# Patient Record
Sex: Male | Born: 1970 | Race: White | Hispanic: No | Marital: Single | Smoking: Current every day smoker
Health system: Southern US, Community
[De-identification: ages and names within clinical notes are randomized; demographics above are authoritative.]

## PROBLEM LIST (undated history)

## (undated) DIAGNOSIS — Z87442 Personal history of urinary calculi: Secondary | ICD-10-CM

## (undated) DIAGNOSIS — R0600 Dyspnea, unspecified: Secondary | ICD-10-CM

## (undated) DIAGNOSIS — K759 Inflammatory liver disease, unspecified: Secondary | ICD-10-CM

---

## 2010-02-17 ENCOUNTER — Emergency Department (HOSPITAL_COMMUNITY)
Admission: EM | Admit: 2010-02-17 | Discharge: 2010-02-17 | Payer: Self-pay | Source: Home / Self Care | Admitting: Emergency Medicine

## 2010-08-14 ENCOUNTER — Emergency Department (HOSPITAL_COMMUNITY)
Admission: EM | Admit: 2010-08-14 | Discharge: 2010-08-15 | Discharge status: Against Medical Advice | Payer: Self-pay | Attending: Emergency Medicine | Admitting: Emergency Medicine

## 2010-08-14 DIAGNOSIS — S0990XA Unspecified injury of head, initial encounter: Secondary | ICD-10-CM | POA: Insufficient documentation

## 2010-08-14 DIAGNOSIS — F17200 Nicotine dependence, unspecified, uncomplicated: Secondary | ICD-10-CM | POA: Insufficient documentation

## 2010-08-14 DIAGNOSIS — F1010 Alcohol abuse, uncomplicated: Secondary | ICD-10-CM | POA: Insufficient documentation

## 2010-08-14 DIAGNOSIS — Y998 Other external cause status: Secondary | ICD-10-CM | POA: Insufficient documentation

## 2010-08-15 ENCOUNTER — Emergency Department (HOSPITAL_COMMUNITY): Payer: Self-pay

## 2011-07-03 ENCOUNTER — Emergency Department (HOSPITAL_COMMUNITY): Payer: Self-pay

## 2011-07-03 ENCOUNTER — Emergency Department (HOSPITAL_COMMUNITY)
Admission: EM | Admit: 2011-07-03 | Discharge: 2011-07-03 | Disposition: A | Discharge status: Home / Self Care | Payer: Self-pay | Attending: Emergency Medicine | Admitting: Emergency Medicine

## 2011-07-03 ENCOUNTER — Encounter (HOSPITAL_COMMUNITY): Payer: Self-pay | Admitting: *Deleted

## 2011-07-03 DIAGNOSIS — R110 Nausea: Secondary | ICD-10-CM | POA: Insufficient documentation

## 2011-07-03 DIAGNOSIS — R319 Hematuria, unspecified: Secondary | ICD-10-CM | POA: Insufficient documentation

## 2011-07-03 LAB — URINALYSIS, ROUTINE W REFLEX MICROSCOPIC
Nitrite: POSITIVE — AB
Specific Gravity, Urine: 1.02 (ref 1.005–1.030)
Urobilinogen, UA: >8 mg/dL — ABNORMAL HIGH (ref 0.0–1.0)
pH: 6.5 (ref 5.0–8.0)

## 2011-07-03 LAB — URINE MICROSCOPIC-ADD ON

## 2011-07-03 MED ORDER — CIPROFLOXACIN HCL 500 MG PO TABS: 500.0000 mg | ORAL_TABLET | Freq: Two times a day (BID) | ORAL | Status: AC

## 2011-07-03 NOTE — ED Notes (Signed)
Pt states that he woke up with blood in his urine. Also c/o a little nausea. Denies pain or fever.

## 2011-07-03 NOTE — ED Provider Notes (Signed)
History   This chart was scribed for Laray Anger, DO by Sofie Rower. The patient was seen in room APA03/APA03 and the patient's care was started at 10:00 AM    CSN: [id number]  Arrival date & time 07/03/11  0909   First MD Initiated Contact with Patient 07/03/11 [phone]      Chief Complaint  Patient presents with  . Hematuria    HPI  Chase Hurley is a 41 y.o. male who presents to the Emergency Department complaining of gradual onset and persistence of constant hematuria since this morning.  Has been associated with mild nausea.  Denies testicular pain/swelling, no flank or back pain, no fever, no trauma.     History  Substance Use Topics  . Smoking status: Current Everyday Smoker -- 1.0 packs/day    Types: Cigarettes  . Smokeless tobacco: Not on file  . Alcohol Use: Yes    Review of Systems ROS: Statement: All systems negative except as marked or noted in the HPI; Constitutional: Negative for fever and chills. ; ; Eyes: Negative for eye pain, redness and discharge. ; ; ENMT: Negative for ear pain, hoarseness, nasal congestion, sinus pressure and sore throat. ; ; Cardiovascular: Negative for chest pain, palpitations, diaphoresis, dyspnea and peripheral edema. ; ; Respiratory: Negative for cough, wheezing and stridor. ; ; Gastrointestinal: +nausea.  Negative for vomiting, diarrhea, abdominal pain, blood in stool, hematemesis, jaundice and rectal bleeding. . ; ; Genitourinary: Negative for dysuria, flank pain and +hematuria.;  Genital:  No penile drainage or rash, no testicular pain or swelling, no scrotal rash or swelling. ; Musculoskeletal: Negative for back pain and neck pain. Negative for swelling and trauma.; ; Skin: Negative for pruritus, rash, abrasions, blisters, bruising and skin lesion.; ; Neuro: Negative for headache, lightheadedness and neck stiffness. Negative for weakness, altered level of consciousness , altered mental status, extremity weakness, paresthesias, involuntary  movement, seizure and syncope.     Allergies  Review of patient's allergies indicates no known allergies.  Home Medications  No current outpatient prescriptions on file.  BP 126/71  Pulse 68  Temp(Src) 98.5 F (36.9 C) (Oral)  Resp 16  Ht 5\' 9"  (1.753 m)  Wt 220 lb (99.791 kg)  BMI 32.49 kg/m2  SpO2 99%  Physical Exam 1005: Physical examination:  Nursing notes reviewed; Vital signs and O2 SAT reviewed;  Constitutional: Well developed, Well nourished, Well hydrated, In no acute distress; Head:  Normocephalic, atraumatic; Eyes: EOMI, PERRL, No scleral icterus; ENMT: Mouth and pharynx normal, Mucous membranes moist; Neck: Supple, Full range of motion, No lymphadenopathy; Cardiovascular: Regular rate and rhythm, No murmur, rub, or gallop; Respiratory: Breath sounds clear & equal bilaterally, No rales, rhonchi, wheezes, or rub, Normal respiratory effort/excursion; Chest: Nontender, Movement normal; Abdomen: Soft, Nontender, Nondistended, Normal bowel sounds; Genitourinary: No CVA tenderness; Genital exam performed with pt permission and male ED Tech chaparone present during exam. No perineal erythema.  No penile lesions or drainage.  No scrotal erythema, edema or tenderness to palp.  Normal testicular lie.  No testicular tenderness to palp.  +cremasteric reflexes bilat.  No inguinal LAN or palpable masses.; Extremities: Pulses normal, No tenderness, No edema, No calf edema or asymmetry.; Neuro: AA&Ox3, Major CN grossly intact. Gait steady. No gross focal motor or sensory deficits in extremities.; Skin: Color normal, Warm, Dry   ED Course  Procedures    MDM  MDM Reviewed: nursing note and vitals Interpretation: CT scan and labs   Results for orders placed during  the hospital encounter of 07/03/11  URINALYSIS, ROUTINE W REFLEX MICROSCOPIC      Component Value Range   Color, Urine RED (*) YELLOW    APPearance CLEAR  CLEAR    Specific Gravity, Urine 1.020  1.005 - 1.030    pH 6.5  5.0  - 8.0    Glucose, UA 250 (*) NEGATIVE (mg/dL)   Hgb urine dipstick LARGE (*) NEGATIVE    Bilirubin Urine SMALL (*) NEGATIVE    Ketones, ur 15 (*) NEGATIVE (mg/dL)   Protein, ur >098 (*) NEGATIVE (mg/dL)   Urobilinogen, UA >1.1 (*) 0.0 - 1.0 (mg/dL)   Nitrite POSITIVE (*) NEGATIVE    Leukocytes, UA MODERATE (*) NEGATIVE   URINE MICROSCOPIC-ADD ON      Component Value Range   WBC, UA TOO NUMEROUS TO COUNT  <3 (WBC/hpf)   RBC / HPF TOO NUMEROUS TO COUNT  <3 (RBC/hpf)   Bacteria, UA MANY (*) RARE    Ct Abdomen Pelvis Wo Contrast 07/03/2011  *RADIOLOGY REPORT*  Clinical Data:  Painless hematuria.  CT ABDOMEN AND PELVIS WITHOUT CONTRAST (CT UROGRAM)  Technique: Contiguous axial images of the abdomen and pelvis without oral or intravenous contrast were obtained.  Comparison: None  Findings:  Exam is limited for evaluation of entities other than urinary tract calculi due to lack of oral or intravenous contrast.   Mild scarring at the left lung base. Normal heart size without pericardial or pleural effusion.  Normal uninfused appearance of the liver.  A splenule.  Normal stomach, pancreas, gallbladder, biliary tract, adrenal glands.  No renal calculi or hydronephrosis.  No evidence of renal mass on uninfused imaging. No hydroureter or ureteric calculi.  No retroperitoneal or retrocrural adenopathy.  Normal colon, appendix, and terminal ileum.  Normal small bowel without abdominal ascites.  Normal urinary bladder and prostate.  No significant free fluid. No acute osseous abnormality.  Degenerative disc disease at the lumbosacral junction.  IMPRESSION:  1.  No urinary tract calculi or hydronephrosis.  No explanation for hematuria. 2.  Please note that stone study is of low sensitivity for non stone causes of hematuria.  If symptoms warrant, dedicated hematuria protocol post contrast abdominal/pelvic CT should be considered.  Original Report Authenticated By: Consuello Bossier, M.D.     12:32 PM:  Pt continues  comfortable.  Pt reports he has urinated several times while in the ED and "my pee is clear now."  No CVAT, no fevers.  No calculi on CT scan.  Will tx for UTI at this time while UC is pending.  Wants to go home now.  Dx testing d/w pt.  Questions answered.  Verb understanding, agreeable to d/c home with outpt f/u.        I personally performed the services described in this documentation, which was scribed in my presence. The recorded information has been reviewed and considered. Niki Cosman Allison Quarry, DO 07/05/11 2146

## 2011-07-03 NOTE — Discharge Instructions (Signed)
RESOURCE GUIDE  Chronic Pain Problems: Contact [facility]  [phone] Patients need to be referred by their primary care doctor.  Insufficient Money for Medicine: Contact United Way:  call "211" or [facility] [phone].  No Primary Care Doctor: - Call [facility]  [phone] - can help you locate a primary care doctor that  accepts your insurance, provides certain services, etc. - Physician Referral Service- 1-800-533-3463  Agencies that provide inexpensive medical care: - [facility]  [phone] - [facility]  [phone] - [facility]  [phone] - [facility]  [phone] - Planned Parenthood  [phone] - [facility]  [phone]  Medicaid-accepting [place] Providers: - [facility]- [address]  [phone], Mon-Fri 9am-7pm, Sat 9am-1pm - [facility]- [address], [address]  [phone] - [facility]- [address], [address]  [phone] - [facility]- [address] [place] [address]  [phone] - Veita Bland- [address], [address], [phone]  Only accepts [place] Access Medicaid patients after they have their name  applied to their card  Self Pay (no insurance) in [place]: - Sickle Cell Patients: Dr Eric Dean, [facility]  [address], [phone] - [facility]- [address]  [phone]       -     [facility] [place] [facility]- [address]       -     [facility]- see information above (Speak to Pam H if you do not have insurance)       -  [facility]- [address], [phone]       -  [facility] [place]- [address],  [phone]       -  [facility]- [address] [place] [address], [phone]       -  Dr Osei-Bonsu-  [address], [place], [phone]       -  [facility]- [address], [phone]       -  [facility]- [address] [place] [address], [phone], also [address], [phone]       -    [facility]- [address], [phone], 1st & 3rd Saturday   every month, 10am-1pm  1) Find a Doctor and Pay Out of Pocket Although you won't have to find out who is covered by your insurance plan, it is a good idea to ask around and get recommendations. You will then need to call the office and see if the doctor you have chosen will accept you as a new patient and what types of options they offer for patients who are self-pay. Some doctors offer discounts or will set up payment plans for their patients who do not have insurance, but you will need to ask so you aren't surprised when you get to your appointment.  2) Contact Your Local Health Department Not all health departments have doctors that can see patients for sick visits, but many do, so it is worth a call to see if yours does. If you don't know where your local health department is, you can check in your phone book. The CDC also has a tool to help you locate your state's health department, and many state websites also have   listings of all of their local health departments.  3) Find a Walk-in Clinic If your illness is not likely to be very severe or complicated, you may want to try a walk in clinic. These are popping up all over the country in pharmacies, drugstores, and shopping centers. They're usually staffed by nurse practitioners or physician assistants that have been trained to treat common illnesses and complaints. They're usually fairly quick and inexpensive. However, if you have serious medical issues or chronic medical problems, these are probably not your best option  STD Testing - [place] [facility] [place], [facility], [address], [place], phone [phone] or [phone].  Monday - Friday, call for an appointment. - [place]  [facility] [place], [facility] Clinic, [address], [place], phone [phone] or [phone].  Monday - Friday, call for an appointment.  Abuse/Neglect: - [place] Child Abuse Hotline (336) 641-3795 - [place] Child Abuse Hotline 800-378-5315 (After Hours)  Emergency Shelter:  [facility] (336) 271-5985  Maternity Homes: - [facility] (336) 275-9566 - [facility] (704) 372-4663  MRSA Hotline #:   832-7006  [place] Resources  [facility] [place]  [employer] [place] [facility] [address]                 [address]         [address]  [place]                                               [place]                              [place] Phone:  [phone]                                  Phone:  [phone]                   Phone:  [phone]  [place] [facility], [phone] - [place] Services - [facility]- 1-888-581-9988       -     [facility] in [place], [address],                                  336-349-4454, Insurance  [place] Child Abuse Hotline (336) 342-1394 or (336) 342-3537 (After Hours)   Behavioral Health Services  Substance Abuse Resources: - [facility]  336-882-2125 - [facility] 336-784-9470 - [facility] 336-285-9073 - [facility] 336-845-3988 - Residential & Outpatient Substance Abuse Program  800-659-3381  Psychological Services: -  Health  [phone] - [facility]  [phone] - [place] [facility], [address], [place], ACCESS LINE: 1-800-853-5163 or 336-641-4981, Http://www.[facility].com/services/adult.htm  Dental Assistance  If unable to pay or uninsured, contact:  [facility] or [place] [facility] to become qualified for the adult dental  clinic.  Patients with Medicaid:  [facility] [address], [phone], [phone]  If unable   to pay, or uninsured, contact [facility] 934-792-8389) or [facility] Department (603)885-4540 in [place], [phone] in [facility]) to become qualified for the adult dental clinic  Other Low-Cost Community Dental Services: - [facility]- [address] [place], [place], [place], [address], [phone], [address]. [phone], 2nd and 4th Thursday of the month at 6:30am.  10 clients each day by appointment, can sometimes see walk-in patients if someone does not show for an appointment. [facility]- [address] [place], [place], [address], [phone] - [facility]- [address] [place], [place], [address], [phone] [facility]- [phone] [facility]- [phone] [facility] [phone]     Take the prescription as directed.  Call the Urologist today to schedule a follow up appointment for this week.  Return to the Emergency Department immediately sooner if worsening.

## 2011-07-04 LAB — URINE CULTURE: Culture  Setup Time: 201306042313

## 2011-11-03 ENCOUNTER — Encounter (HOSPITAL_COMMUNITY): Payer: Self-pay

## 2011-11-03 ENCOUNTER — Emergency Department (HOSPITAL_COMMUNITY): Payer: Self-pay

## 2011-11-03 ENCOUNTER — Emergency Department (HOSPITAL_COMMUNITY)
Admission: EM | Admit: 2011-11-03 | Discharge: 2011-11-03 | Disposition: A | Discharge status: Home / Self Care | Payer: Self-pay | Attending: Emergency Medicine | Admitting: Emergency Medicine

## 2011-11-03 DIAGNOSIS — F17200 Nicotine dependence, unspecified, uncomplicated: Secondary | ICD-10-CM | POA: Insufficient documentation

## 2011-11-03 DIAGNOSIS — J189 Pneumonia, unspecified organism: Secondary | ICD-10-CM | POA: Insufficient documentation

## 2011-11-03 DIAGNOSIS — Z91038 Other insect allergy status: Secondary | ICD-10-CM | POA: Insufficient documentation

## 2011-11-03 MED ORDER — AZITHROMYCIN 250 MG PO TABS: ORAL_TABLET | ORAL | Status: DC

## 2011-11-03 MED ORDER — HYDROCOD POLST-CHLORPHEN POLST 10-8 MG/5ML PO LQCR: 5.0000 mL | Freq: Two times a day (BID) | ORAL | Status: DC | PRN

## 2011-11-03 NOTE — ED Provider Notes (Signed)
History  This chart was scribed for Benny Lennert, MD by Erskine Emery. This patient was seen in room APA06/APA06 and the patient's care was started at 09:37.   CSN: [id number]  Arrival date & time 11/03/11  4098   First MD Initiated Contact with Patient 11/03/11 [phone]      Chief Complaint  Patient presents with  . Influenza    (Consider location/radiation/quality/duration/timing/severity/associated sxs/prior Treatment) Chase Hurley is a 41 y.o. male who presents to the Emergency Department complaining of rhinorrhea and a productive cough (with green phlegm) for the past 2 weeks. Pt reports associated insomnia from coughing all night but denies any congestion. Pt reports taking Nyquil, Theraflu, and mucinex with no relief from symptoms. Patient is a 41 y.o. male presenting with cough. The history is provided by the patient. No language interpreter was used.  Cough This is a new problem. The current episode started more than 1 week ago. The problem occurs every few minutes. The problem has not changed since onset.The cough is productive of sputum (green phlegm). There has been no fever. Associated symptoms include rhinorrhea. Pertinent negatives include no chest pain and no headaches. He has tried decongestants (nyquil, theraflu) for the symptoms. The treatment provided no relief. He is a smoker.  Pt is a smoker.  History reviewed. No pertinent past medical history.  History reviewed. No pertinent past surgical history.  No family history on file.  History  Substance Use Topics  . Smoking status: Current Every Day Smoker -- 1.0 packs/day    Types: Cigarettes  . Smokeless tobacco: Not on file  . Alcohol Use: Yes      Review of Systems  Constitutional: Negative for fatigue.  HENT: Positive for rhinorrhea. Negative for congestion, sinus pressure and ear discharge.   Eyes: Negative for discharge.  Respiratory: Positive for cough.   Cardiovascular: Negative for chest pain.    Gastrointestinal: Negative for abdominal pain and diarrhea.  Genitourinary: Negative for frequency and hematuria.  Musculoskeletal: Negative for back pain.  Skin: Negative for rash.  Neurological: Negative for seizures and headaches.  Hematological: Negative.   Psychiatric/Behavioral: Positive for disturbed wake/sleep cycle. Negative for hallucinations.    Allergies  Bee venom  Home Medications   Current Outpatient Rx  Name Route Sig Dispense Refill  . ASPIRIN-CAFFEINE 845-65 MG PO PACK Oral Take 1 Package by mouth as needed. For toothache      Triage Vitals: BP 117/74  Pulse 78  Temp 98.3 F (36.8 C) (Oral)  Resp 20  Ht 5\' 9"  (1.753 m)  Wt 220 lb (99.791 kg)  BMI 32.49 kg/m2  SpO2 100%  Physical Exam  Nursing note and vitals reviewed. Constitutional: He is oriented to person, place, and time. He appears well-developed.  HENT:  Head: Normocephalic and atraumatic.  Eyes: Conjunctivae normal and EOM are normal. No scleral icterus.  Neck: Neck supple. No thyromegaly present.  Cardiovascular: Normal rate and regular rhythm.  Exam reveals no gallop and no friction rub.   No murmur heard. Pulmonary/Chest: No stridor. He has no wheezes. He has no rales. He exhibits no tenderness.  Abdominal: He exhibits no distension. There is no tenderness. There is no rebound.  Musculoskeletal: Normal range of motion. He exhibits no edema.  Lymphadenopathy:    He has no cervical adenopathy.  Neurological: He is oriented to person, place, and time. Coordination normal.  Skin: No rash noted. No erythema.  Psychiatric: He has a normal mood and affect. His behavior is normal.  ED Course  Procedures (including critical care time) DIAGNOSTIC STUDIES: Oxygen Saturation is 100% on room air, normal by my interpretation.    COORDINATION OF CARE: 09:42--I evaluated the patient and we discussed a treatment plan including chest x-ray to which the pt agreed.   11:33--I rechecked the pt and  notified him that he has pneumonia. I told him I would would write him for some antibiotics to take home as I see no need for him to be admitted. Pt has no PCP.  Labs Reviewed - No data to display No results found.   No diagnosis found.    MDM        The chart was scribed for me under my direct supervision.  I personally performed the history, physical, and medical decision making and all procedures in the evaluation of this patient.Benny Lennert, MD 11/03/11 1136

## 2011-11-03 NOTE — ED Notes (Signed)
Pt reports being sick for 2 weeks w/ flu

## 2015-03-10 ENCOUNTER — Telehealth: Payer: Self-pay | Admitting: *Deleted

## 2015-03-10 NOTE — Telephone Encounter (Signed)
Patient called requesting hydrocodone 120  to be refilled. Please advise [phone]

## 2015-03-14 ENCOUNTER — Telehealth: Payer: Self-pay

## 2015-03-14 MED ORDER — HYDROCODONE-ACETAMINOPHEN 5-325 MG PO TABS: 1.0000 | ORAL_TABLET | ORAL | Status: DC | PRN

## 2015-03-14 NOTE — Telephone Encounter (Signed)
Tried to contact patient about hydrocodone, rx is ready for pick up.  Unable to leave message.

## 2015-03-14 NOTE — Telephone Encounter (Signed)
Patient called about his hydrocodone rx.  He says he usually gets 7.5.  Told patient I would look into this issue and call him back.  I called [facility].  The pharmasict says he does usually get 7.5 but the patient needed medication so he got the 5/325 filled.  Pharmacy advised patient he will need to make an appointment to get a different rx next month.

## 2015-03-14 NOTE — Telephone Encounter (Signed)
Rx done. 

## 2015-03-15 ENCOUNTER — Telehealth: Payer: Self-pay

## 2015-03-15 NOTE — Telephone Encounter (Signed)
Patient called requesting to talk to Dr Hilda Lias.  He is upset about his hydrocodone rx.  He was given an rx for 5/325 and got it filled but he normally gets 7.5/325.  Patient would like Dr Hilda Lias to call him.  Advised patient Dr Hilda Lias was with patients and that at this point he would not refill his medication again since he got the others filled.  Tried to offer the patient an appointment but he hung up.  Please advise.

## 2015-04-11 ENCOUNTER — Other Ambulatory Visit: Payer: Self-pay | Admitting: *Deleted

## 2015-04-11 ENCOUNTER — Telehealth: Payer: Self-pay | Admitting: Orthopaedic Surgery

## 2015-04-11 MED ORDER — HYDROCODONE-ACETAMINOPHEN 5-325 MG PO TABS: 1.0000 | ORAL_TABLET | ORAL | Status: DC | PRN

## 2015-04-11 NOTE — Telephone Encounter (Signed)
Patient called to request refill - HYDROcodone-acetaminophen (NORCO/VICODIN) - states that had been 7.5] - quantity 1 20- aware that Dr Romeo AppleHarrison is reviewing and issuing refill requests while Dr Hilda LiasKeeling is out.  Patient [phone]#[phone]

## 2015-04-11 NOTE — Telephone Encounter (Signed)
Patient aware prescription available for pick up 

## 2015-05-17 ENCOUNTER — Ambulatory Visit (INDEPENDENT_AMBULATORY_CARE_PROVIDER_SITE_OTHER): Payer: Medicaid Other

## 2015-05-17 ENCOUNTER — Encounter: Payer: Self-pay | Admitting: Orthopaedic Surgery

## 2015-05-17 ENCOUNTER — Ambulatory Visit: Payer: Self-pay | Admitting: Orthopaedic Surgery

## 2015-05-17 ENCOUNTER — Ambulatory Visit (INDEPENDENT_AMBULATORY_CARE_PROVIDER_SITE_OTHER): Payer: Medicaid Other | Admitting: Orthopaedic Surgery

## 2015-05-17 VITALS — BP 133/81 | HR 68 | Temp 98.1°F | Ht 69.0 in | Wt 214.0 lb

## 2015-05-17 DIAGNOSIS — M5441 Lumbago with sciatica, right side: Secondary | ICD-10-CM

## 2015-05-17 MED ORDER — TIZANIDINE HCL 4 MG PO TABS: ORAL_TABLET | ORAL | Status: DC

## 2015-05-17 MED ORDER — OXYCODONE-ACETAMINOPHEN 5-325 MG PO TABS: 1.0000 | ORAL_TABLET | ORAL | Status: DC | PRN

## 2015-05-17 MED ORDER — NAPROXEN 500 MG PO TABS: 500.0000 mg | ORAL_TABLET | Freq: Two times a day (BID) | ORAL | Status: DC

## 2015-05-17 NOTE — Progress Notes (Signed)
Patient EA:VWUJWJ Chase Hurley, male DOB:[birth year], 45 y.o. [id number]  Chief Complaint  Patient presents with  . Follow-up    back pain    HPI  Chase Hurley is a 45 y.o. male who has worsening of his lower back pain.  He has right sided sciatica.  He has no new trauma.  He has no bowel or bladder problems.  Back Pain This is a chronic problem. The current episode started more than 1 year ago. The problem occurs daily. The problem has been rapidly worsening since onset. The pain is present in the lumbar spine. The quality of the pain is described as aching, burning and shooting. The pain radiates to the right foot and right knee. The pain is at a severity of 6/10. The pain is moderate. The pain is the same all the time. The symptoms are aggravated by bending, lying down, coughing, stress and standing. Stiffness is present all day. Pertinent negatives include no chest pain. He has tried analgesics, bed rest, heat, home exercises, muscle relaxant, ice, NSAIDs and walking for the symptoms. The treatment provided mild relief.   I will get a MRI of the lumbar spine as he is getting worse.  Body mass index is 31.59 kg/(m^2).  Review of Systems  Constitutional:       He is a current smoker.  HENT: Negative for congestion.   Respiratory: Positive for cough and shortness of breath.   Cardiovascular: Negative for chest pain and leg swelling.  Endocrine: Positive for cold intolerance.  Musculoskeletal: Positive for myalgias, back pain, arthralgias and gait problem.  Allergic/Immunologic: Positive for environmental allergies.    History reviewed. No pertinent past medical history.  History reviewed. No pertinent past surgical history.  History reviewed. No pertinent family history.  Social History Social History  Substance Use Topics  . Smoking status: Current Every Day Smoker -- 1.00 packs/day    Types: Cigarettes  . Smokeless tobacco: None  . Alcohol Use: Yes    Allergies  Allergen  Reactions  . Bee Venom Swelling    Current Outpatient Prescriptions  Medication Sig Dispense Refill  . gabapentin (NEURONTIN) 300 MG capsule Take 300 mg by mouth 3 (three) times daily.    Marland Kitchen azithromycin (ZITHROMAX Z-PAK) 250 MG tablet 2 po initially then one each day for 4 days (Patient not taking: Reported on 05/17/2015) 6 tablet 0  . chlorpheniramine-HYDROcodone (TUSSIONEX PENNKINETIC ER) 10-8 MG/5ML LQCR Take 5 mLs by mouth every 12 (twelve) hours as needed. (Patient not taking: Reported on 05/17/2015) 115 mL 0  . naproxen (NAPROSYN) 500 MG tablet Take 1 tablet (500 mg total) by mouth 2 (two) times daily with a meal. 60 tablet 5  . oxyCODONE-acetaminophen (PERCOCET/ROXICET) 5-325 MG tablet Take 1 tablet by mouth every 4 (four) hours as needed for moderate pain or severe pain (Must last 30 days.  Do not drive or operate machinery while taking this medicine). 120 tablet 0  . tiZANidine (ZANAFLEX) 4 MG tablet One by mouth every 8 hours as needed for spasm 90 tablet 3   No current facility-administered medications for this visit.     Physical Exam  Blood pressure 133/81, pulse 68, temperature 98.1 F (36.7 C), height  (1.753 m), weight 214 lb (97.07 kg).  Constitutional: overall normal hygiene, normal nutrition, well developed, normal grooming, normal body habitus. Assistive device:none  Musculoskeletal: gait and station Limp none, muscle tone and strength are normal, no tremors or atrophy is present.  .  Neurological: coordination overall  normal.  Deep tendon reflex/nerve stretch intact.  Sensation normal.  Cranial nerves II-XII intact.   Skin:   normal overall no scars, lesions, ulcers or rashes. No psoriasis.  Psychiatric: Alert and oriented x 3.  Recent memory intact, remote memory unclear.  Normal mood and affect. Well groomed.  Good eye contact.  Cardiovascular: overall no swelling, no varicosities, no edema bilaterally, normal temperatures of the legs and arms, no clubbing,  cyanosis and good capillary refill.  Lymphatic: palpation is normal. Spine/Pelvis examination:  Inspection:  Overall, sacoiliac joint benign and hips nontender; without crepitus or defects.   Thoracic spine inspection: Alignment normal without kyphosis present   Lumbar spine inspection:  Alignment  with normal lumbar lordosis, without scoliosis apparent.   Thoracic spine palpation:  without tenderness of spinal processes   Lumbar spine palpation: with tenderness of lumbar area; with tightness of lumbar muscles    Range of Motion:   Lumbar flexion, forward flexion is 30  with pain or tenderness    Lumbar extension is 5  with pain or tenderness   Left lateral bend is Normal  without pain or tenderness   Right lateral bend is Normal without pain or tenderness   Straight leg raising is positive at 25 on the right   Strength & tone: Normal   Stability overall normal stability   Additional services performed: x-rays of the lumbar spine were done and reported separately.  I have talked to him about his smoking. He is not willing to stop.  He is willing to cut back some.  I have talked about his chronic pain medicine. I have mentioned a pain clinic.  He will ponder this.  The patient has been educated about the nature of the problem(s) and counseled on treatment options.  The patient appeared to understand what I have discussed and is in agreement with it.  Encounter Diagnosis  Name Primary?  . Bilateral low back pain with right-sided sciatica Yes    PLAN Call if any problems.  Precautions discussed.  Continue current medications. I have added Zanaflex and Naprosyn.  Return to clinic after MRI of the lumbar spine.

## 2015-05-17 NOTE — Patient Instructions (Signed)
Set up MRI of lower back.

## 2015-05-25 ENCOUNTER — Ambulatory Visit (HOSPITAL_COMMUNITY): Payer: Medicaid Other

## 2015-05-25 ENCOUNTER — Ambulatory Visit (HOSPITAL_COMMUNITY)
Admission: RE | Admit: 2015-05-25 | Discharge: 2015-05-25 | Disposition: A | Discharge status: Home / Self Care | Payer: Medicaid Other | Source: Ambulatory Visit | Attending: Orthopaedic Surgery | Admitting: Orthopaedic Surgery

## 2015-05-25 DIAGNOSIS — M5127 Other intervertebral disc displacement, lumbosacral region: Secondary | ICD-10-CM | POA: Diagnosis not present

## 2015-05-25 DIAGNOSIS — M5126 Other intervertebral disc displacement, lumbar region: Secondary | ICD-10-CM | POA: Diagnosis not present

## 2015-05-25 DIAGNOSIS — M47817 Spondylosis without myelopathy or radiculopathy, lumbosacral region: Secondary | ICD-10-CM | POA: Diagnosis not present

## 2015-05-25 DIAGNOSIS — M5441 Lumbago with sciatica, right side: Secondary | ICD-10-CM | POA: Insufficient documentation

## 2015-05-31 ENCOUNTER — Ambulatory Visit: Payer: Medicaid Other | Admitting: Orthopaedic Surgery

## 2015-06-07 ENCOUNTER — Encounter: Payer: Self-pay | Admitting: Orthopaedic Surgery

## 2015-06-07 ENCOUNTER — Ambulatory Visit (INDEPENDENT_AMBULATORY_CARE_PROVIDER_SITE_OTHER): Payer: Medicaid Other | Admitting: Orthopaedic Surgery

## 2015-06-07 VITALS — BP 141/80 | HR 74 | Temp 98.6°F | Resp 16 | Ht 67.0 in | Wt 220.0 lb

## 2015-06-07 DIAGNOSIS — M5441 Lumbago with sciatica, right side: Secondary | ICD-10-CM | POA: Diagnosis not present

## 2015-06-07 NOTE — Progress Notes (Signed)
Patient RU:EAVWUJ:Chase Hurley, male DOB:Mar 21, 1970, 45 y.o. WJX:[id number]  Chief Complaint  Patient presents with  . Follow-up    MRI review L spine    HPI  Chase Hurley is a 45 y.o. male who has right sided sciatica.  He has pain rather severe at times.  He is not getting any better.  He had a MRI of the lumbar spine.  It showed:  IMPRESSION: Overall mild spondylosis most notable at L5-S1 where there is a shallow broad-based central and left-sided disc protrusion causing mild narrowing in the left lateral recess without nerve root compression.  Very shallow disc bulge L4-5 without central canal or foraminal Stenosis.  I will have him seen for possible epidural.  His sciatica is on the right side but his left side had the disc protrusion.  I have explained this to him. HPI  Body mass index is 34.45 kg/(m^2).   Review of Systems  Constitutional:       He is a current smoker.  HENT: Negative for congestion.   Respiratory: Positive for cough and shortness of breath.   Cardiovascular: Negative for chest pain and leg swelling.  Endocrine: Positive for cold intolerance.  Musculoskeletal: Positive for myalgias, back pain, arthralgias and gait problem.  Allergic/Immunologic: Positive for environmental allergies.    History reviewed. No pertinent past medical history.  History reviewed. No pertinent past surgical history.  History reviewed. No pertinent family history.  Social History Social History  Substance Use Topics  . Smoking status: Current Every Day Smoker -- 1.00 packs/day    Types: Cigarettes  . Smokeless tobacco: None  . Alcohol Use: Yes    Allergies  Allergen Reactions  . Bee Venom Swelling    Current Outpatient Prescriptions  Medication Sig Dispense Refill  . gabapentin (NEURONTIN) 300 MG capsule Take 300 mg by mouth 3 (three) times daily.    . naproxen (NAPROSYN) 500 MG tablet Take 1 tablet (500 mg total) by mouth 2 (two) times daily with a meal. 60  tablet 5  . oxyCODONE-acetaminophen (PERCOCET/ROXICET) 5-325 MG tablet Take 1 tablet by mouth every 4 (four) hours as needed for moderate pain or severe pain (Must last 30 days.  Do not drive or operate machinery while taking this medicine). 120 tablet 0  . tiZANidine (ZANAFLEX) 4 MG tablet One by mouth every 8 hours as needed for spasm 90 tablet 3   No current facility-administered medications for this visit.     Physical Exam  Blood pressure 141/80, pulse 74, temperature 98.6 F (37 C), resp. rate 16, height 5\' 7"  (1.702 m), weight 220 lb (99.791 kg).  Constitutional: overall normal hygiene, normal nutrition, well developed, normal grooming, normal body habitus. Assistive device:none  Musculoskeletal: gait and station Limp none, muscle tone and strength are normal, no tremors or atrophy is present.  .  Neurological: coordination overall normal.  Deep tendon reflex/nerve stretch intact.  Sensation normal.  Cranial nerves II-XII intact.   Skin:   normal overall no scars, lesions, ulcers or rashes. No psoriasis.  Psychiatric: Alert and oriented x 3.  Recent memory intact, remote memory unclear.  Normal mood and affect. Well groomed.  Good eye contact.  Cardiovascular: overall no swelling, no varicosities, no edema bilaterally, normal temperatures of the legs and arms, no clubbing, cyanosis and good capillary refill.  Lymphatic: palpation is normal. Spine/Pelvis examination:  Inspection:  Overall, sacoiliac joint benign and hips nontender; without crepitus or defects.   Thoracic spine inspection: Alignment normal without kyphosis present  Lumbar spine inspection:  Alignment  with normal lumbar lordosis, without scoliosis apparent.   Thoracic spine palpation:  without tenderness of spinal processes   Lumbar spine palpation: with tenderness of lumbar area; with tightness of lumbar muscles    Range of Motion:   Lumbar flexion, forward flexion is 30  with pain or  tenderness    Lumbar extension is 5  with pain or tenderness   Left lateral bend is Normal  without pain or tenderness   Right lateral bend is Normal without pain or tenderness   Straight leg raising is Normal   Strength & tone: Normal   Stability overall normal stability   The patient has been educated about the nature of the problem(s) and counseled on treatment options.  The patient appeared to understand what I have discussed and is in agreement with it.  Encounter Diagnosis  Name Primary?  . Right-sided low back pain with right-sided sciatica Yes    PLAN Call if any problems.  Precautions discussed.  Continue current medications.   Return to clinic 1 month   Schedule for evaluation for epidural injection(s).

## 2015-06-08 ENCOUNTER — Other Ambulatory Visit: Payer: Self-pay | Admitting: Orthopaedic Surgery

## 2015-06-08 DIAGNOSIS — M5441 Lumbago with sciatica, right side: Secondary | ICD-10-CM

## 2015-06-14 ENCOUNTER — Telehealth: Payer: Self-pay | Admitting: Orthopaedic Surgery

## 2015-06-14 ENCOUNTER — Ambulatory Visit
Admission: RE | Admit: 2015-06-14 | Discharge: 2015-06-14 | Disposition: A | Discharge status: Home / Self Care | Payer: Medicaid Other | Source: Ambulatory Visit | Attending: Orthopaedic Surgery | Admitting: Orthopaedic Surgery

## 2015-06-14 DIAGNOSIS — M5441 Lumbago with sciatica, right side: Secondary | ICD-10-CM

## 2015-06-14 MED ORDER — IOPAMIDOL (ISOVUE-M 200) INJECTION 41%
1.0000 mL | Freq: Once | INTRAMUSCULAR | Status: AC
  Administered 2015-06-14: 1 mL via EPIDURAL

## 2015-06-14 MED ORDER — METHYLPREDNISOLONE ACETATE 40 MG/ML INJ SUSP (RADIOLOG
120.0000 mg | Freq: Once | INTRAMUSCULAR | Status: AC
  Administered 2015-06-14: 120 mg via EPIDURAL

## 2015-06-14 MED ORDER — OXYCODONE-ACETAMINOPHEN 5-325 MG PO TABS: 1.0000 | ORAL_TABLET | ORAL | Status: DC | PRN

## 2015-06-14 NOTE — Discharge Instructions (Signed)

## 2015-06-14 NOTE — Telephone Encounter (Signed)
Rx done. 

## 2015-06-14 NOTE — Telephone Encounter (Signed)
Patient called for refill of medicatino:  oxyCODONE-acetaminophen (PERCOCET/ROXICET) 5-325 MG tablet [[id number]][[id number]] - quantity 120. I relayed it is due 18th of the month. Patient aware.

## 2015-07-13 ENCOUNTER — Telehealth: Payer: Self-pay | Admitting: Orthopaedic Surgery

## 2015-07-13 MED ORDER — OXYCODONE-ACETAMINOPHEN 5-325 MG PO TABS: 1.0000 | ORAL_TABLET | ORAL | Status: DC | PRN

## 2015-07-13 NOTE — Telephone Encounter (Signed)
Rx done. 

## 2015-07-13 NOTE — Telephone Encounter (Signed)
Oxycodone- Acetaminophen 5/325mg   Qty 120 Tablets °

## 2015-08-10 ENCOUNTER — Encounter: Payer: Self-pay | Admitting: Orthopaedic Surgery

## 2015-08-10 ENCOUNTER — Ambulatory Visit (INDEPENDENT_AMBULATORY_CARE_PROVIDER_SITE_OTHER): Payer: Medicaid Other | Admitting: Orthopaedic Surgery

## 2015-08-10 VITALS — BP 128/80 | HR 74 | Temp 97.5°F | Ht 70.0 in | Wt 215.0 lb

## 2015-08-10 DIAGNOSIS — M5441 Lumbago with sciatica, right side: Secondary | ICD-10-CM | POA: Diagnosis not present

## 2015-08-10 MED ORDER — OXYCODONE-ACETAMINOPHEN 5-325 MG PO TABS: 1.0000 | ORAL_TABLET | ORAL | Status: DC | PRN

## 2015-08-10 NOTE — Progress Notes (Signed)
Patient Chase Hurley Bella Hurley, male DOB:03-Nov-1970, 45 y.o. VWU:[id number]  Chief Complaint  Patient presents with  . Follow-up    low back pain    HPI  DORAL VENTRELLA is a 45 y.o. male who has lower back pain.  He had an epidural about two months ago and did well with it but his pain has returned.  He called [facility] to get another one and they told him he had to see me first.  I will arrange this.  He has right sided paresthesias.  He is doing his exercises and taking his medicine.  He has no weakness or new trauma.  HPI  Body mass index is 30.85 kg/(m^2).  ROS  Review of Systems  Constitutional:       He is a current smoker.  HENT: Negative for congestion.   Respiratory: Positive for cough and shortness of breath.   Cardiovascular: Negative for chest pain and leg swelling.  Endocrine: Positive for cold intolerance.  Musculoskeletal: Positive for myalgias, back pain, arthralgias and gait problem.  Allergic/Immunologic: Positive for environmental allergies.    History reviewed. No pertinent past medical history.  History reviewed. No pertinent past surgical history.  History reviewed. No pertinent family history.  Social History Social History  Substance Use Topics  . Smoking status: Current Every Day Smoker -- 1.00 packs/day    Types: Cigarettes  . Smokeless tobacco: None  . Alcohol Use: Yes    Allergies  Allergen Reactions  . Bee Venom Swelling    Current Outpatient Prescriptions  Medication Sig Dispense Refill  . gabapentin (NEURONTIN) 300 MG capsule Take 300 mg by mouth 3 (three) times daily.    . naproxen (NAPROSYN) 500 MG tablet Take 1 tablet (500 mg total) by mouth 2 (two) times daily with a meal. 60 tablet 5  . oxyCODONE-acetaminophen (PERCOCET/ROXICET) 5-325 MG tablet Take 1 tablet by mouth every 4 (four) hours as needed for moderate pain or severe pain (Must last 30 days.  Do not drive or operate machinery while taking this medicine). 120 tablet 0  .  tiZANidine (ZANAFLEX) 4 MG tablet One by mouth every 8 hours as needed for spasm 90 tablet 3   No current facility-administered medications for this visit.     Physical Exam  Blood pressure 128/80, pulse 74, temperature 97.5 F (36.4 C), height  (1.778 m), weight 215 lb (97.523 kg).  Constitutional: overall normal hygiene, normal nutrition, well developed, normal grooming, normal body habitus. Assistive device:none  Musculoskeletal: gait and station Limp none, muscle tone and strength are normal, no tremors or atrophy is present.  .  Neurological: coordination overall normal.  Deep tendon reflex/nerve stretch intact.  Sensation normal.  Cranial nerves II-XII intact.   Skin:   normal overall no scars, lesions, ulcers or rashes. No psoriasis.  Psychiatric: Alert and oriented x 3.  Recent memory intact, remote memory unclear.  Normal mood and affect. Well groomed.  Good eye contact.  Cardiovascular: overall no swelling, no varicosities, no edema bilaterally, normal temperatures of the legs and arms, no clubbing, cyanosis and good capillary refill.  Lymphatic: palpation is normal.  Spine/Pelvis examination:  Inspection:  Overall, sacoiliac joint benign and hips nontender; without crepitus or defects.   Thoracic spine inspection: Alignment normal without kyphosis present   Lumbar spine inspection:  Alignment  with normal lumbar lordosis, without scoliosis apparent.   Thoracic spine palpation:  without tenderness of spinal processes   Lumbar spine palpation: with tenderness of lumbar area; without  tightness of lumbar muscles    Range of Motion:   Lumbar flexion, forward flexion is 35  with pain or tenderness    Lumbar extension is 5  with pain or tenderness   Left lateral bend is Normal  without pain or tenderness   Right lateral bend is Normal without pain or tenderness   Straight leg raising is Normal   Strength & tone: Normal   Stability overall normal stability      The patient has been educated about the nature of the problem(s) and counseled on treatment options.  The patient appeared to understand what I have discussed and is in agreement with it.  Encounter Diagnosis  Name Primary?  . Right-sided low back pain with right-sided sciatica Yes    PLAN Call if any problems.  Precautions discussed.  Continue current medications.   Return to clinic 1 month   We will call and set up second epidural.  Electronically Signed Darreld McleanWayne Rollo Farquhar, MD 7/12/20173:52 PM

## 2015-08-11 ENCOUNTER — Other Ambulatory Visit: Payer: Self-pay | Admitting: Radiology

## 2015-08-11 DIAGNOSIS — M5441 Lumbago with sciatica, right side: Secondary | ICD-10-CM

## 2015-09-08 ENCOUNTER — Ambulatory Visit (INDEPENDENT_AMBULATORY_CARE_PROVIDER_SITE_OTHER): Payer: Medicaid Other | Admitting: Orthopaedic Surgery

## 2015-09-08 ENCOUNTER — Encounter: Payer: Self-pay | Admitting: Orthopaedic Surgery

## 2015-09-08 VITALS — BP 122/78 | HR 69 | Temp 98.1°F | Ht 69.5 in | Wt 214.8 lb

## 2015-09-08 DIAGNOSIS — M5441 Lumbago with sciatica, right side: Secondary | ICD-10-CM

## 2015-09-08 MED ORDER — OXYCODONE-ACETAMINOPHEN 5-325 MG PO TABS: 1.0000 | ORAL_TABLET | ORAL | 0 refills | Status: DC | PRN

## 2015-09-08 NOTE — Progress Notes (Signed)
Patient Chase Hurley, male DOB:[birth year], 45 y.o. [id number]  Chief Complaint  Patient presents with  . Follow-up    Back    HPI  Chase Hurley is a 45 y.o. male who has continued pain of the lower back.  He was to have had an epidural of the lumbar spine (second one) this past month.  He has called Psychiatric nurse and was told they would call him back in a day or two.  They did not.  He called again and was told the same thing.  He has not gotten an appointment.  He comes here today for [place] to assist him in doing this.  He is also out of his pain medicine.  He has no new trauma.  He has right sided sciatica that is still present and not helped with rest, heat, ice, rubs. HPI  Body mass index is 31.27 kg/m.  ROS  Review of Systems  Constitutional:       He is a current smoker.  HENT: Negative for congestion.   Respiratory: Positive for cough and shortness of breath.   Cardiovascular: Negative for chest pain and leg swelling.  Endocrine: Positive for cold intolerance.  Musculoskeletal: Positive for arthralgias, back pain, gait problem and myalgias.  Allergic/Immunologic: Positive for environmental allergies.    History reviewed. No pertinent past medical history.  History reviewed. No pertinent surgical history.  History reviewed. No pertinent family history.  Social History Social History  Substance Use Topics  . Smoking status: Current Every Day Smoker    Packs/day: 1.00    Types: Cigarettes  . Smokeless tobacco: Never Used  . Alcohol use Yes    Allergies  Allergen Reactions  . Bee Venom Swelling    Current Outpatient Prescriptions  Medication Sig Dispense Refill  . gabapentin (NEURONTIN) 300 MG capsule Take 300 mg by mouth 3 (three) times daily.    . naproxen (NAPROSYN) 500 MG tablet Take 1 tablet (500 mg total) by mouth 2 (two) times daily with a meal. 60 tablet 5  . oxyCODONE-acetaminophen (PERCOCET/ROXICET) 5-325 MG tablet Take 1 tablet by mouth  every 4 (four) hours as needed for moderate pain or severe pain (Must last 30 days.  Do not drive or operate machinery while taking this medicine). 120 tablet 0  . tiZANidine (ZANAFLEX) 4 MG tablet One by mouth every 8 hours as needed for spasm 90 tablet 3   No current facility-administered medications for this visit.      Physical Exam  Blood pressure 122/78, pulse 69, temperature 98.1 F (36.7 C), height 5' 9.5" (1.765 m), weight 214 lb 12.8 oz (97.4 kg).  Constitutional: overall normal hygiene, normal nutrition, well developed, normal grooming, normal body habitus. Assistive device:none  Musculoskeletal: gait and station Limp none, muscle tone and strength are normal, no tremors or atrophy is present.  .  Neurological: coordination overall normal.  Deep tendon reflex/nerve stretch intact.  Sensation normal.  Cranial nerves II-XII intact.   Skin:   normal overall no scars, lesions, ulcers or rashes. No psoriasis.  Psychiatric: Alert and oriented x 3.  Recent memory intact, remote memory unclear.  Normal mood and affect. Well groomed.  Good eye contact.  Cardiovascular: overall no swelling, no varicosities, no edema bilaterally, normal temperatures of the legs and arms, no clubbing, cyanosis and good capillary refill.  Lymphatic: palpation is normal.  Spine/Pelvis examination:  Inspection:  Overall, sacoiliac joint benign and hips nontender; without crepitus or defects.   Thoracic spine inspection: Alignment  normal without kyphosis present   Lumbar spine inspection:  Alignment  with normal lumbar lordosis, without scoliosis apparent.   Thoracic spine palpation:  without tenderness of spinal processes   Lumbar spine palpation: with tenderness of lumbar area; with tightness of lumbar muscles    Range of Motion:   Lumbar flexion, forward flexion is 30 with pain or tenderness    Lumbar extension is 5 with pain or tenderness   Left lateral bend is Normal  without pain or  tenderness   Right lateral bend is Normal without pain or tenderness   Straight leg raising is Abnormal- 25 degrees on the right   Strength & tone: Normal   Stability overall normal stability     The patient has been educated about the nature of the problem(s) and counseled on treatment options.  The patient appeared to understand what I have discussed and is in agreement with it.  Encounter Diagnosis  Name Primary?  . Right-sided low back pain with right-sided sciatica Yes    PLAN Call if any problems.  Precautions discussed.  Continue current medications.   Return to clinic 1 month   We called and got him an appointment for this Monday, August 14 for injection at 10:15.  Electronically Signed Darreld McleanWayne Sarahbeth Cashin, MD 8/10/20174:31 PM

## 2015-09-08 NOTE — Patient Instructions (Signed)
[facility] on Monday, September 12, 2015 at 10:15.  No solid food after 6:30 that morning.  [phone] if any questions or problems.

## 2015-09-12 ENCOUNTER — Inpatient Hospital Stay: Admission: RE | Admit: 2015-09-12 | Payer: Medicaid Other | Source: Ambulatory Visit

## 2015-09-19 ENCOUNTER — Ambulatory Visit
Admission: RE | Admit: 2015-09-19 | Discharge: 2015-09-19 | Disposition: A | Discharge status: Home / Self Care | Payer: Medicaid Other | Source: Ambulatory Visit | Attending: Orthopaedic Surgery | Admitting: Orthopaedic Surgery

## 2015-09-19 DIAGNOSIS — M5441 Lumbago with sciatica, right side: Secondary | ICD-10-CM

## 2015-09-19 MED ORDER — IOPAMIDOL (ISOVUE-M 200) INJECTION 41%
1.0000 mL | Freq: Once | INTRAMUSCULAR | Status: AC
  Administered 2015-09-19: 1 mL via EPIDURAL

## 2015-09-19 MED ORDER — METHYLPREDNISOLONE ACETATE 40 MG/ML INJ SUSP (RADIOLOG
120.0000 mg | Freq: Once | INTRAMUSCULAR | Status: AC
  Administered 2015-09-19: 120 mg via EPIDURAL

## 2015-09-19 NOTE — Discharge Instructions (Signed)

## 2015-10-06 ENCOUNTER — Ambulatory Visit: Payer: Medicaid Other | Admitting: Orthopaedic Surgery

## 2015-10-11 ENCOUNTER — Encounter: Payer: Self-pay | Admitting: Orthopaedic Surgery

## 2015-10-11 ENCOUNTER — Ambulatory Visit (INDEPENDENT_AMBULATORY_CARE_PROVIDER_SITE_OTHER): Payer: Medicaid Other | Admitting: Orthopaedic Surgery

## 2015-10-11 VITALS — Ht 70.0 in | Wt 219.0 lb

## 2015-10-11 DIAGNOSIS — Z720 Tobacco use: Secondary | ICD-10-CM | POA: Diagnosis not present

## 2015-10-11 DIAGNOSIS — F17210 Nicotine dependence, cigarettes, uncomplicated: Secondary | ICD-10-CM | POA: Diagnosis not present

## 2015-10-11 DIAGNOSIS — M5441 Lumbago with sciatica, right side: Secondary | ICD-10-CM

## 2015-10-11 DIAGNOSIS — F17200 Nicotine dependence, unspecified, uncomplicated: Secondary | ICD-10-CM

## 2015-10-11 MED ORDER — OXYCODONE-ACETAMINOPHEN 5-325 MG PO TABS: ORAL_TABLET | ORAL | 0 refills | Status: DC

## 2015-10-11 NOTE — Progress Notes (Signed)
Patient [name], male DOB:1970/07/27, 45 y.o. [id number]  Chief Complaint  Patient presents with  . Follow-up    Back pain    HPI  Chase Hurley is a 45 y.o. male who has chronic pain of the lumbar spine.  He had the epidurals but still has the lower back pain and radiation to the right side.  He is to go back to them and be re-evaluated next week.  He is doing his exercises.  He has no new trauma.  He smokes. I have talked to him about this and he is trying to cut back. HPI  Body mass index is 31.42 kg/m.  ROS  Review of Systems  Constitutional:       He is a current smoker.  HENT: Negative for congestion.   Respiratory: Positive for cough and shortness of breath.   Cardiovascular: Negative for chest pain and leg swelling.  Endocrine: Positive for cold intolerance.  Musculoskeletal: Positive for arthralgias, back pain, gait problem and myalgias.  Allergic/Immunologic: Positive for environmental allergies.    No past medical history on file.  No past surgical history on file.  No family history on file.  Social History Social History  Substance Use Topics  . Smoking status: Current Every Day Smoker    Packs/day: 1.00    Types: Cigarettes  . Smokeless tobacco: Never Used  . Alcohol use Yes    Allergies  Allergen Reactions  . Bee Venom Swelling    Current Outpatient Prescriptions  Medication Sig Dispense Refill  . gabapentin (NEURONTIN) 300 MG capsule Take 300 mg by mouth 3 (three) times daily.    . naproxen (NAPROSYN) 500 MG tablet Take 1 tablet (500 mg total) by mouth 2 (two) times daily with a meal. 60 tablet 5  . oxyCODONE-acetaminophen (PERCOCET/ROXICET) 5-325 MG tablet One tablet every six hours for pain. 56 tablet 0  . tiZANidine (ZANAFLEX) 4 MG tablet One by mouth every 8 hours as needed for spasm 90 tablet 3   No current facility-administered medications for this visit.      Physical Exam  Height 5\' 10"  (1.778 m), weight 219 lb (99.3  kg).  Constitutional: overall normal hygiene, normal nutrition, well developed, normal grooming, normal body habitus. Assistive device:none  Musculoskeletal: gait and station Limp none, muscle tone and strength are normal, no tremors or atrophy is present.  .  Neurological: coordination overall normal.  Deep tendon reflex/nerve stretch intact.  Sensation normal.  Cranial nerves II-XII intact.   Skin:   Normal overall no scars, lesions, ulcers or rashes. No psoriasis.  Psychiatric: Alert and oriented x 3.  Recent memory intact, remote memory unclear.  Normal mood and affect. Well groomed.  Good eye contact.  Cardiovascular: overall no swelling, no varicosities, no edema bilaterally, normal temperatures of the legs and arms, no clubbing, cyanosis and good capillary refill.  Lymphatic: palpation is normal.  Spine/Pelvis examination:  Inspection:  Overall, sacoiliac joint benign and hips nontender; without crepitus or defects.   Thoracic spine inspection: Alignment normal without kyphosis present   Lumbar spine inspection:  Alignment  with normal lumbar lordosis, without scoliosis apparent.   Thoracic spine palpation:  without tenderness of spinal processes   Lumbar spine palpation: with tenderness of lumbar area; without tightness of lumbar muscles    Range of Motion:   Lumbar flexion, forward flexion is 40 without pain or tenderness    Lumbar extension is 10 without pain or tenderness   Left lateral bend is Normal  without pain or tenderness   Right lateral bend is Normal without pain or tenderness   Straight leg raising is Normal   Strength & tone: Normal   Stability overall normal stability     The patient has been educated about the nature of the problem(s) and counseled on treatment options.  The patient appeared to understand what I have discussed and is in agreement with it.  Encounter Diagnoses  Name Primary?  . Right-sided low back pain with right-sided sciatica Yes   . Tobacco smoker within last 12 months   . Cigarette nicotine dependence without complication     PLAN Call if any problems.  Precautions discussed.  Continue current medications.   Return to clinic 1 month   Electronically Signed Darreld McleanWayne Navjot Loera, MD 9/12/20171:55 PM

## 2015-10-25 ENCOUNTER — Telehealth: Payer: Self-pay | Admitting: Orthopaedic Surgery

## 2015-10-25 MED ORDER — OXYCODONE-ACETAMINOPHEN 5-325 MG PO TABS: ORAL_TABLET | ORAL | 0 refills | Status: DC

## 2015-10-25 NOTE — Telephone Encounter (Signed)
Patient requests refill:  oxyCODONE-acetaminophen (PERCOCET/ROXICET) 5-325 MG tablet 56 tablet   - insurance is Medicaid.

## 2015-11-08 ENCOUNTER — Ambulatory Visit (INDEPENDENT_AMBULATORY_CARE_PROVIDER_SITE_OTHER): Payer: Medicaid Other | Admitting: Orthopaedic Surgery

## 2015-11-08 ENCOUNTER — Encounter: Payer: Self-pay | Admitting: Orthopaedic Surgery

## 2015-11-08 VITALS — BP 127/81 | HR 72 | Ht 70.0 in | Wt 217.0 lb

## 2015-11-08 DIAGNOSIS — G8929 Other chronic pain: Secondary | ICD-10-CM

## 2015-11-08 DIAGNOSIS — M5441 Lumbago with sciatica, right side: Secondary | ICD-10-CM

## 2015-11-08 DIAGNOSIS — F17210 Nicotine dependence, cigarettes, uncomplicated: Secondary | ICD-10-CM | POA: Diagnosis not present

## 2015-11-08 DIAGNOSIS — F17200 Nicotine dependence, unspecified, uncomplicated: Secondary | ICD-10-CM

## 2015-11-08 MED ORDER — OXYCODONE-ACETAMINOPHEN 5-325 MG PO TABS: ORAL_TABLET | ORAL | 0 refills | Status: DC

## 2015-11-08 NOTE — Progress Notes (Signed)
Patient UJ:WJXBJY:Chase Bella KennedyA Hurley, male DOB:February 14, 1970, 45 y.o. [id number]  Chief Complaint  Patient presents with  . Follow-up    Low Back Pain    HPI  Chase Hurley is a 45 y.o. male who has chronic lower back pain.  He is not any better, he may be worse. He would like to have the epidurals again as they have helped in the past.  I will set it up for him. HPI  Body mass index is 31.14 kg/m.  ROS  Review of Systems  Constitutional:       He is a current smoker.  HENT: Negative for congestion.   Respiratory: Positive for cough and shortness of breath.   Cardiovascular: Negative for chest pain and leg swelling.  Endocrine: Positive for cold intolerance.  Musculoskeletal: Positive for arthralgias, back pain, gait problem and myalgias.  Allergic/Immunologic: Positive for environmental allergies.    History reviewed. No pertinent past medical history.  History reviewed. No pertinent surgical history.  History reviewed. No pertinent family history.  Social History Social History  Substance Use Topics  . Smoking status: Current Every Day Smoker    Packs/day: 1.00    Types: Cigarettes  . Smokeless tobacco: Never Used  . Alcohol use Yes    Allergies  Allergen Reactions  . Bee Venom Swelling    Current Outpatient Prescriptions  Medication Sig Dispense Refill  . gabapentin (NEURONTIN) 300 MG capsule Take 300 mg by mouth 3 (three) times daily.    . naproxen (NAPROSYN) 500 MG tablet Take 1 tablet (500 mg total) by mouth 2 (two) times daily with a meal. 60 tablet 5  . oxyCODONE-acetaminophen (PERCOCET/ROXICET) 5-325 MG tablet One tablet every six hours for pain. 50 tablet 0  . tiZANidine (ZANAFLEX) 4 MG tablet One by mouth every 8 hours as needed for spasm 90 tablet 3   No current facility-administered medications for this visit.      Physical Exam  Blood pressure 127/81, pulse 72, height 5\' 10"  (1.778 m), weight 217 lb (98.4 kg).  Constitutional: overall normal hygiene,  normal nutrition, well developed, normal grooming, normal body habitus. Assistive device:none  Musculoskeletal: gait and station Limp none, muscle tone and strength are normal, no tremors or atrophy is present.  .  Neurological: coordination overall normal.  Deep tendon reflex/nerve stretch intact.  Sensation normal.  Cranial nerves II-XII intact.   Skin:   Normal overall no scars, lesions, ulcers or rashes. No psoriasis.  Psychiatric: Alert and oriented x 3.  Recent memory intact, remote memory unclear.  Normal mood and affect. Well groomed.  Good eye contact.  Cardiovascular: overall no swelling, no varicosities, no edema bilaterally, normal temperatures of the legs and arms, no clubbing, cyanosis and good capillary refill.  Lymphatic: palpation is normal.  Spine/Pelvis examination:  Inspection:  Overall, sacoiliac joint benign and hips nontender; without crepitus or defects.   Thoracic spine inspection: Alignment normal without kyphosis present   Lumbar spine inspection:  Alignment  with normal lumbar lordosis, without scoliosis apparent.   Thoracic spine palpation:  without tenderness of spinal processes   Lumbar spine palpation: with tenderness of lumbar area; without tightness of lumbar muscles    Range of Motion:   Lumbar flexion, forward flexion is 35 with pain or tenderness    Lumbar extension is 5 with pain or tenderness   Left lateral bend is Normal  without pain or tenderness   Right lateral bend is Normal without pain or tenderness   Straight leg raising  is Normal   Strength & tone: Normal   Stability overall normal stability     The patient has been educated about the nature of the problem(s) and counseled on treatment options.  The patient appeared to understand what I have discussed and is in agreement with it.  Encounter Diagnoses  Name Primary?  . Chronic right-sided low back pain with right-sided sciatica Yes  . Tobacco smoker within last 12 months   .  Cigarette nicotine dependence without complication     PLAN Call if any problems.  Precautions discussed.  Continue current medications.   Return to clinic 1 month   Set up epidurals.  Electronically Signed Darreld Mclean, MD 10/10/20172:25 PM

## 2015-11-21 ENCOUNTER — Telehealth: Payer: Self-pay | Admitting: Orthopaedic Surgery

## 2015-11-21 NOTE — Telephone Encounter (Signed)
Patient requests refill:  oxyCODONE-acetaminophen (PERCOCET/ROXICET) 5-325 MG tablet 50 tablet 0 11/08/2015   - insurance: Medicare and Medicaid

## 2015-11-22 MED ORDER — OXYCODONE-ACETAMINOPHEN 5-325 MG PO TABS: ORAL_TABLET | ORAL | 0 refills | Status: DC

## 2015-11-25 ENCOUNTER — Inpatient Hospital Stay: Admission: RE | Admit: 2015-11-25 | Payer: Medicaid Other | Source: Ambulatory Visit

## 2015-12-06 ENCOUNTER — Encounter: Payer: Self-pay | Admitting: Orthopaedic Surgery

## 2015-12-06 ENCOUNTER — Ambulatory Visit (INDEPENDENT_AMBULATORY_CARE_PROVIDER_SITE_OTHER): Payer: Medicaid Other | Admitting: Orthopaedic Surgery

## 2015-12-06 VITALS — BP 115/71 | HR 64 | Temp 98.1°F | Ht 70.0 in | Wt 219.0 lb

## 2015-12-06 DIAGNOSIS — F17210 Nicotine dependence, cigarettes, uncomplicated: Secondary | ICD-10-CM

## 2015-12-06 DIAGNOSIS — M5441 Lumbago with sciatica, right side: Secondary | ICD-10-CM

## 2015-12-06 DIAGNOSIS — G8929 Other chronic pain: Secondary | ICD-10-CM

## 2015-12-06 MED ORDER — OXYCODONE-ACETAMINOPHEN 5-325 MG PO TABS: ORAL_TABLET | ORAL | 0 refills | Status: DC

## 2015-12-06 NOTE — Progress Notes (Signed)
Patient Chase:Chase Hurley, male DOB:November 26, 1970, 45 y.o. [id number]  Chief Complaint  Patient presents with  . Follow-up    Back pain    HPI  Chase Hurley is a 45 y.o. male who has chronic lower back pain with right sided sciatica.  He is scheduled for epidurals again in another week.  He has had more pain with the cooler weather.  He has no new trauma.  He is doing his exercises and taking his medicine. HPI  Body mass index is 31.42 kg/m.  ROS  Review of Systems  Constitutional:       He is a current smoker.  HENT: Negative for congestion.   Respiratory: Positive for cough and shortness of breath.   Cardiovascular: Negative for chest pain and leg swelling.  Endocrine: Positive for cold intolerance.  Musculoskeletal: Positive for arthralgias, back pain, gait problem and myalgias.  Allergic/Immunologic: Positive for environmental allergies.    History reviewed. No pertinent past medical history.  History reviewed. No pertinent surgical history.  History reviewed. No pertinent family history.  Social History Social History  Substance Use Topics  . Smoking status: Current Every Day Smoker    Packs/day: 1.00    Types: Cigarettes  . Smokeless tobacco: Never Used  . Alcohol use Yes    Allergies  Allergen Reactions  . Bee Venom Swelling    Current Outpatient Prescriptions  Medication Sig Dispense Refill  . gabapentin (NEURONTIN) 300 MG capsule Take 300 mg by mouth 3 (three) times daily.    . naproxen (NAPROSYN) 500 MG tablet Take 1 tablet (500 mg total) by mouth 2 (two) times daily with a meal. 60 tablet 5  . oxyCODONE-acetaminophen (PERCOCET/ROXICET) 5-325 MG tablet One tablet every six hours for pain. 50 tablet 0  . tiZANidine (ZANAFLEX) 4 MG tablet One by mouth every 8 hours as needed for spasm 90 tablet 3   No current facility-administered medications for this visit.      Physical Exam  Blood pressure 115/71, pulse 64, temperature 98.1 F (36.7 C), height  5\' 10"  (1.778 m), weight 219 lb (99.3 kg).  Constitutional: overall normal hygiene, normal nutrition, well developed, normal grooming, normal body habitus. Assistive device:none  Musculoskeletal: gait and station Limp none, muscle tone and strength are normal, no tremors or atrophy is present.  .  Neurological: coordination overall normal.  Deep tendon reflex/nerve stretch intact.  Sensation normal.  Cranial nerves II-XII intact.   Skin:   Normal overall no scars, lesions, ulcers or rashes. No psoriasis.  Psychiatric: Alert and oriented x 3.  Recent memory intact, remote memory unclear.  Normal mood and affect. Well groomed.  Good eye contact.  Cardiovascular: overall no swelling, no varicosities, no edema bilaterally, normal temperatures of the legs and arms, no clubbing, cyanosis and good capillary refill.  Lymphatic: palpation is normal.  Spine/Pelvis examination:  Inspection:  Overall, sacoiliac joint benign and hips nontender; without crepitus or defects.   Thoracic spine inspection: Alignment normal without kyphosis present   Lumbar spine inspection:  Alignment  with normal lumbar lordosis, without scoliosis apparent.   Thoracic spine palpation:  without tenderness of spinal processes   Lumbar spine palpation: with tenderness of lumbar area; without tightness of lumbar muscles    Range of Motion:   Lumbar flexion, forward flexion is 35 with pain or tenderness    Lumbar extension is 5 with pain or tenderness   Left lateral bend is Normal  without pain or tenderness   Right lateral bend  is Normal without pain or tenderness   Straight leg raising is Normal   Strength & tone: Normal   Stability overall normal stability     The patient has been educated about the nature of the problem(s) and counseled on treatment options.  The patient appeared to understand what I have discussed and is in agreement with it.  Encounter Diagnoses  Name Primary?  . Chronic right-sided low  back pain with right-sided sciatica Yes  . Cigarette nicotine dependence without complication     PLAN Call if any problems.  Precautions discussed.  Continue current medications.   Return to clinic 6 weeks   Electronically Signed Darreld McleanWayne Benjamin Casanas, MD 11/7/20172:41 PM

## 2015-12-14 ENCOUNTER — Ambulatory Visit
Admission: RE | Admit: 2015-12-14 | Discharge: 2015-12-14 | Disposition: A | Discharge status: Home / Self Care | Payer: Medicaid Other | Source: Ambulatory Visit | Attending: Orthopaedic Surgery | Admitting: Orthopaedic Surgery

## 2015-12-14 DIAGNOSIS — G8929 Other chronic pain: Secondary | ICD-10-CM

## 2015-12-14 DIAGNOSIS — M5441 Lumbago with sciatica, right side: Principal | ICD-10-CM

## 2015-12-14 MED ORDER — IOPAMIDOL (ISOVUE-M 200) INJECTION 41%
1.0000 mL | Freq: Once | INTRAMUSCULAR | Status: AC
  Administered 2015-12-14: 1 mL via EPIDURAL

## 2015-12-14 MED ORDER — METHYLPREDNISOLONE ACETATE 40 MG/ML INJ SUSP (RADIOLOG
120.0000 mg | Freq: Once | INTRAMUSCULAR | Status: AC
  Administered 2015-12-14: 120 mg via EPIDURAL

## 2015-12-20 ENCOUNTER — Telehealth: Payer: Self-pay | Admitting: Orthopaedic Surgery

## 2015-12-20 MED ORDER — OXYCODONE-ACETAMINOPHEN 5-325 MG PO TABS: ORAL_TABLET | ORAL | 0 refills | Status: DC

## 2015-12-20 NOTE — Telephone Encounter (Signed)
Patient requests refill, also states that he was to mention that "Dr Hilda LiasKeeling was to give the same medication again"   oxyCODONE-acetaminophen (PERCOCET/ROXICET) 5-325 MG tablet 50 tablet

## 2016-01-04 ENCOUNTER — Telehealth: Payer: Self-pay | Admitting: Orthopaedic Surgery

## 2016-01-04 MED ORDER — OXYCODONE-ACETAMINOPHEN 5-325 MG PO TABS: ORAL_TABLET | ORAL | 0 refills | Status: DC

## 2016-01-04 NOTE — Telephone Encounter (Signed)
Patient requests a refill on Oxycodone/Acetaminophen 5-325  Mgs.   Qty  45   Sig: One tablet every six hours for pain.

## 2016-01-17 ENCOUNTER — Encounter: Payer: Self-pay | Admitting: Orthopaedic Surgery

## 2016-01-17 ENCOUNTER — Ambulatory Visit (INDEPENDENT_AMBULATORY_CARE_PROVIDER_SITE_OTHER): Payer: Medicaid Other | Admitting: Orthopaedic Surgery

## 2016-01-17 VITALS — BP 139/90 | HR 75 | Temp 98.1°F | Ht 70.0 in | Wt 225.0 lb

## 2016-01-17 DIAGNOSIS — F17210 Nicotine dependence, cigarettes, uncomplicated: Secondary | ICD-10-CM | POA: Diagnosis not present

## 2016-01-17 DIAGNOSIS — G8929 Other chronic pain: Secondary | ICD-10-CM | POA: Diagnosis not present

## 2016-01-17 DIAGNOSIS — M5441 Lumbago with sciatica, right side: Secondary | ICD-10-CM | POA: Diagnosis not present

## 2016-01-17 MED ORDER — OXYCODONE-ACETAMINOPHEN 5-325 MG PO TABS: ORAL_TABLET | ORAL | 0 refills | Status: DC

## 2016-01-17 MED ORDER — NAPROXEN 500 MG PO TABS: 500.0000 mg | ORAL_TABLET | Freq: Two times a day (BID) | ORAL | 5 refills | Status: DC

## 2016-01-17 NOTE — Progress Notes (Signed)
Patient GN:Chase Hurley:Chase Hurley, male DOB:[birth year], 45 y.o. [id number]  Chief Complaint  Patient presents with  . Back Pain    HPI  Chase Hurley is a 45 y.o. male who has chronic lower back pain with right sided sciatica.  He had MRI in late April of this year and was negative for HNP.  He is not taking his Naprosyn and I will resume that.  He is to do his exercises.  He has no new acute injury.    He continues to smoke and is not willing to quit. HPI  Body mass index is 32.28 kg/m.  ROS  Review of Systems  Constitutional:       He is a current smoker.  HENT: Negative for congestion.   Respiratory: Positive for cough and shortness of breath.   Cardiovascular: Negative for chest pain and leg swelling.  Endocrine: Positive for cold intolerance.  Musculoskeletal: Positive for arthralgias, back pain, gait problem and myalgias.  Allergic/Immunologic: Positive for environmental allergies.    No past medical history on file.  No past surgical history on file.  No family history on file.  Social History Social History  Substance Use Topics  . Smoking status: Current Every Day Smoker    Packs/day: 1.00    Types: Cigarettes  . Smokeless tobacco: Never Used  . Alcohol use Yes    Allergies  Allergen Reactions  . Bee Venom Swelling    Current Outpatient Prescriptions  Medication Sig Dispense Refill  . gabapentin (NEURONTIN) 300 MG capsule Take 300 mg by mouth 3 (three) times daily.    . naproxen (NAPROSYN) 500 MG tablet Take 1 tablet (500 mg total) by mouth 2 (two) times daily with a meal. 60 tablet 5  . oxyCODONE-acetaminophen (PERCOCET/ROXICET) 5-325 MG tablet One tablet every six hours for pain. 40 tablet 0  . tiZANidine (ZANAFLEX) 4 MG tablet One by mouth every 8 hours as needed for spasm 90 tablet 3   No current facility-administered medications for this visit.      Physical Exam  Blood pressure 139/90, pulse 75, temperature 98.1 F (36.7 C), height 5\' 10"   (1.778 m), weight 225 lb (102.1 kg).  Constitutional: overall normal hygiene, normal nutrition, well developed, normal grooming, normal body habitus. Assistive device:none  Musculoskeletal: gait and station Limp none, muscle tone and strength are normal, no tremors or atrophy is present.  .  Neurological: coordination overall normal.  Deep tendon reflex/nerve stretch intact.  Sensation normal.  Cranial nerves II-XII intact.   Skin:   Normal overall no scars, lesions, ulcers or rashes. No psoriasis.  Psychiatric: Alert and oriented x 3.  Recent memory intact, remote memory unclear.  Normal mood and affect. Well groomed.  Good eye contact.  Cardiovascular: overall no swelling, no varicosities, no edema bilaterally, normal temperatures of the legs and arms, no clubbing, cyanosis and good capillary refill.  Lymphatic: palpation is normal.  Spine/Pelvis examination:  Inspection:  Overall, sacoiliac joint benign and hips nontender; without crepitus or defects.   Thoracic spine inspection: Alignment normal without kyphosis present   Lumbar spine inspection:  Alignment  with normal lumbar lordosis, without scoliosis apparent.   Thoracic spine palpation:  without tenderness of spinal processes   Lumbar spine palpation: with tenderness of lumbar area; without tightness of lumbar muscles    Range of Motion:   Lumbar flexion, forward flexion is 45 without pain or tenderness    Lumbar extension is 10 without pain or tenderness   Left lateral bend  is Normal  without pain or tenderness   Right lateral bend is Normal without pain or tenderness   Straight leg raising is Normal   Strength & tone: Normal   Stability overall normal stability     The patient has been educated about the nature of the problem(s) and counseled on treatment options.  The patient appeared to understand what I have discussed and is in agreement with it.  Encounter Diagnoses  Name Primary?  . Chronic right-sided low  back pain with right-sided sciatica Yes  . Cigarette nicotine dependence without complication     PLAN Call if any problems.  Precautions discussed.  Continue current medications.   Return to clinic 3 months   Electronically Signed Darreld McleanWayne Mariona Scholes, MD 12/19/20172:05 PM

## 2016-01-17 NOTE — Patient Instructions (Signed)
Steps to Quit Smoking Smoking tobacco can be harmful to your health and can affect almost every organ in your body. Smoking puts you, and those around you, at risk for developing many serious chronic diseases. Quitting smoking is difficult, but it is one of the best things that you can do for your health. It is never too late to quit. What are the benefits of quitting smoking? When you quit smoking, you lower your risk of developing serious diseases and conditions, such as:  Lung cancer or lung disease, such as COPD.  Heart disease.  Stroke.  Heart attack.  Infertility.  Osteoporosis and bone fractures.  Additionally, symptoms such as coughing, wheezing, and shortness of breath may get better when you quit. You may also find that you get sick less often because your body is stronger at fighting off colds and infections. If you are pregnant, quitting smoking can help to reduce your chances of having a baby of low birth weight. How do I get ready to quit? When you decide to quit smoking, create a plan to make sure that you are successful. Before you quit:  Pick a date to quit. Set a date within the next two weeks to give you time to prepare.  Write down the reasons why you are quitting. Keep this list in places where you will see it often, such as on your bathroom mirror or in your car or wallet.  Identify the people, places, things, and activities that make you want to smoke (triggers) and avoid them. Make sure to take these actions: ? Throw away all cigarettes at home, at work, and in your car. ? Throw away smoking accessories, such as ashtrays and lighters. ? Clean your car and make sure to empty the ashtray. ? Clean your home, including curtains and carpets.  Tell your family, friends, and coworkers that you are quitting. Support from your loved ones can make quitting easier.  Talk with your health care provider about your options for quitting smoking.  Find out what treatment  options are covered by your health insurance.  What strategies can I use to quit smoking? Talk with your healthcare provider about different strategies to quit smoking. Some strategies include:  Quitting smoking altogether instead of gradually lessening how much you smoke over a period of time. Research shows that quitting "cold turkey" is more successful than gradually quitting.  Attending in-person counseling to help you build problem-solving skills. You are more likely to have success in quitting if you attend several counseling sessions. Even short sessions of 10 minutes can be effective.  Finding resources and support systems that can help you to quit smoking and remain smoke-free after you quit. These resources are most helpful when you use them often. They can include: ? Online chats with a counselor. ? Telephone quitlines. ? Printed self-help materials. ? Support groups or group counseling. ? Text messaging programs. ? Mobile phone applications.  Taking medicines to help you quit smoking. (If you are pregnant or breastfeeding, talk with your health care provider first.) Some medicines contain nicotine and some do not. Both types of medicines help with cravings, but the medicines that include nicotine help to relieve withdrawal symptoms. Your health care provider may recommend: ? Nicotine patches, gum, or lozenges. ? Nicotine inhalers or sprays. ? Non-nicotine medicine that is taken by mouth.  Talk with your health care provider about combining strategies, such as taking medicines while you are also receiving in-person counseling. Using these two strategies together   makes you more likely to succeed in quitting than if you used either strategy on its own. If you are pregnant or breastfeeding, talk with your health care provider about finding counseling or other support strategies to quit smoking. Do not take medicine to help you quit smoking unless told to do so by your health care  provider. What things can I do to make it easier to quit? Quitting smoking might feel overwhelming at first, but there is a lot that you can do to make it easier. Take these important actions:  Reach out to your family and friends and ask that they support and encourage you during this time. Call telephone quitlines, reach out to support groups, or work with a counselor for support.  Ask people who smoke to avoid smoking around you.  Avoid places that trigger you to smoke, such as bars, parties, or smoke-break areas at work.  Spend time around people who do not smoke.  Lessen stress in your life, because stress can be a smoking trigger for some people. To lessen stress, try: ? Exercising regularly. ? Deep-breathing exercises. ? Yoga. ? Meditating. ? Performing a body scan. This involves closing your eyes, scanning your body from head to toe, and noticing which parts of your body are particularly tense. Purposefully relax the muscles in those areas.  Download or purchase mobile phone or tablet apps (applications) that can help you stick to your quit plan by providing reminders, tips, and encouragement. There are many free apps, such as QuitGuide from the CDC (Centers for Disease Control and Prevention). You can find other support for quitting smoking (smoking cessation) through smokefree.gov and other websites.  How will I feel when I quit smoking? Within the first 24 hours of quitting smoking, you may start to feel some withdrawal symptoms. These symptoms are usually most noticeable 2-3 days after quitting, but they usually do not last beyond 2-3 weeks. Changes or symptoms that you might experience include:  Mood swings.  Restlessness, anxiety, or irritation.  Difficulty concentrating.  Dizziness.  Strong cravings for sugary foods in addition to nicotine.  Mild weight gain.  Constipation.  Nausea.  Coughing or a sore throat.  Changes in how your medicines work in your  body.  A depressed mood.  Difficulty sleeping (insomnia).  After the first 2-3 weeks of quitting, you may start to notice more positive results, such as:  Improved sense of smell and taste.  Decreased coughing and sore throat.  Slower heart rate.  Lower blood pressure.  Clearer skin.  The ability to breathe more easily.  Fewer sick days.  Quitting smoking is very challenging for most people. Do not get discouraged if you are not successful the first time. Some people need to make many attempts to quit before they achieve long-term success. Do your best to stick to your quit plan, and talk with your health care provider if you have any questions or concerns. This information is not intended to replace advice given to you by your health care provider. Make sure you discuss any questions you have with your health care provider. Document Released: 01/09/2001 Document Revised: 09/13/2015 Document Reviewed: 06/01/2014 Elsevier Interactive Patient Education  2017 [employer]  

## 2016-01-31 ENCOUNTER — Telehealth: Payer: Self-pay | Admitting: Orthopaedic Surgery

## 2016-01-31 MED ORDER — OXYCODONE-ACETAMINOPHEN 5-325 MG PO TABS: ORAL_TABLET | ORAL | 0 refills | Status: DC

## 2016-01-31 NOTE — Telephone Encounter (Signed)
Patient called to request refill:  oxyCODONE-acetaminophen (PERCOCET/ROXICET) 5-325 MG tablet 40 tablet    - states has no computer or smartphone access to do requests via MyChart  - insurance is Medicaid

## 2016-02-01 ENCOUNTER — Telehealth: Payer: Self-pay | Admitting: Orthopaedic Surgery

## 2016-02-20 ENCOUNTER — Telehealth: Payer: Self-pay | Admitting: Orthopaedic Surgery

## 2016-02-20 NOTE — Telephone Encounter (Signed)
Oxycodone-Acetaminophen  5/325mg  Qty 40 Tablets °

## 2016-02-21 MED ORDER — HYDROCODONE-ACETAMINOPHEN 7.5-325 MG PO TABS: ORAL_TABLET | ORAL | 0 refills | Status: DC

## 2016-03-07 ENCOUNTER — Telehealth: Payer: Self-pay | Admitting: Orthopaedic Surgery

## 2016-03-07 MED ORDER — HYDROCODONE-ACETAMINOPHEN 7.5-325 MG PO TABS: 1.0000 | ORAL_TABLET | Freq: Four times a day (QID) | ORAL | 0 refills | Status: DC | PRN

## 2016-03-07 NOTE — Telephone Encounter (Signed)
Pt requesting refill of Oxycodone-Acetaminophen 5/325mg     [phone]

## 2016-03-20 ENCOUNTER — Telehealth: Payer: Self-pay | Admitting: Orthopaedic Surgery

## 2016-03-20 MED ORDER — HYDROCODONE-ACETAMINOPHEN 7.5-325 MG PO TABS: 1.0000 | ORAL_TABLET | Freq: Four times a day (QID) | ORAL | 0 refills | Status: DC | PRN

## 2016-03-20 NOTE — Telephone Encounter (Signed)
Patient called and requested a refill on Hydrocodone/Acetaminophen 7.5-325  Mgs.    Qty 28  Sig: Take 1 tablet by mouth every 6 (six) hours as needed for moderate pain. Take one tablet by mouth every six hours as needed for pain, Seven day limit per Medicaid rules.    He states he doesn't have a computer and knows nothing about one therefore he cannot put request in online.

## 2016-03-28 ENCOUNTER — Telehealth: Payer: Self-pay | Admitting: Orthopaedic Surgery

## 2016-03-28 NOTE — Telephone Encounter (Signed)
Patient requests refill on Hydrocodone/Acetaminophen 7.5-325  Mgs.   Qty  28   Sig: Take 1 tablet by mouth every 6 (six) hours as needed for moderate pain. Take one tablet by mouth every six hours as needed for pain, Seven day limit per Medicaid rules.

## 2016-03-28 NOTE — Telephone Encounter (Signed)
He is going to have to get his family doctor to give narcotics.  He has Medicaid and I am very limited here.

## 2016-03-29 ENCOUNTER — Telehealth: Payer: Self-pay | Admitting: Orthopaedic Surgery

## 2016-03-29 NOTE — Telephone Encounter (Signed)
Patient called back regarding his pain medication refill.   I read the message from Dr. Hilda LiasKeeling to the patient stating that he could no longer give him narcotic refills and that he would need to get medication from his PCP.  Mr. Chase Hurley got upset with me and said that he does not have a PCP.  He said that he needs his pain pills and I told him that Dr. Hilda LiasKeeling must follow the guidelines and laws regarding the prescribing of narcotics.  He said "well if he dont give me my pain medicine, he can be expecting a lawsuit."  Then he said to be "F--k you!  I said to Mr. Chase Hurley, "Sir, please dont cuss at me."  He then said "kiss my a--"!Marland Kitchen.  At that time, I hung up the telephone.  I went to Dr. Hilda LiasKeeling and told him that what happened.  I also advised my Research officer, political partypractice administrator, Chase Hurley of what happened with this patient.

## 2016-04-17 ENCOUNTER — Ambulatory Visit: Payer: Medicaid Other | Admitting: Orthopaedic Surgery

## 2016-04-27 ENCOUNTER — Emergency Department (HOSPITAL_COMMUNITY)
Admission: EM | Admit: 2016-04-27 | Discharge: 2016-04-27 | Disposition: A | Discharge status: Home / Self Care | Payer: Medicaid Other | Attending: Emergency Medicine | Admitting: Emergency Medicine

## 2016-04-27 ENCOUNTER — Emergency Department (HOSPITAL_COMMUNITY): Payer: Medicaid Other

## 2016-04-27 ENCOUNTER — Encounter (HOSPITAL_COMMUNITY): Payer: Self-pay | Admitting: Emergency Medicine

## 2016-04-27 DIAGNOSIS — Y92410 Unspecified street and highway as the place of occurrence of the external cause: Secondary | ICD-10-CM | POA: Insufficient documentation

## 2016-04-27 DIAGNOSIS — S301XXA Contusion of abdominal wall, initial encounter: Secondary | ICD-10-CM | POA: Insufficient documentation

## 2016-04-27 DIAGNOSIS — R079 Chest pain, unspecified: Secondary | ICD-10-CM | POA: Insufficient documentation

## 2016-04-27 DIAGNOSIS — Y999 Unspecified external cause status: Secondary | ICD-10-CM | POA: Diagnosis not present

## 2016-04-27 DIAGNOSIS — S5010XA Contusion of unspecified forearm, initial encounter: Secondary | ICD-10-CM

## 2016-04-27 DIAGNOSIS — S5012XA Contusion of left forearm, initial encounter: Secondary | ICD-10-CM | POA: Diagnosis not present

## 2016-04-27 DIAGNOSIS — Y9389 Activity, other specified: Secondary | ICD-10-CM | POA: Insufficient documentation

## 2016-04-27 DIAGNOSIS — F17210 Nicotine dependence, cigarettes, uncomplicated: Secondary | ICD-10-CM | POA: Insufficient documentation

## 2016-04-27 DIAGNOSIS — S59912A Unspecified injury of left forearm, initial encounter: Secondary | ICD-10-CM | POA: Diagnosis present

## 2016-04-27 LAB — URINALYSIS, ROUTINE W REFLEX MICROSCOPIC
Bilirubin Urine: NEGATIVE
Glucose, UA: NEGATIVE mg/dL
Hgb urine dipstick: NEGATIVE
Ketones, ur: NEGATIVE mg/dL
LEUKOCYTES UA: NEGATIVE
Nitrite: NEGATIVE
PROTEIN: NEGATIVE mg/dL
SPECIFIC GRAVITY, URINE: 1.018 (ref 1.005–1.030)
pH: 5 (ref 5.0–8.0)

## 2016-04-27 MED ORDER — IOPAMIDOL (ISOVUE-300) INJECTION 61%
100.0000 mL | Freq: Once | INTRAVENOUS | Status: AC | PRN
  Administered 2016-04-27: 100 mL via INTRAVENOUS

## 2016-04-27 MED ORDER — PROMETHAZINE HCL 12.5 MG PO TABS
12.5000 mg | ORAL_TABLET | Freq: Once | ORAL | Status: AC
  Administered 2016-04-27: 12.5 mg via ORAL
  Filled 2016-04-27: qty 1

## 2016-04-27 MED ORDER — HYDROCODONE-ACETAMINOPHEN 5-325 MG PO TABS: 1.0000 | ORAL_TABLET | ORAL | 0 refills | Status: DC | PRN

## 2016-04-27 MED ORDER — LORAZEPAM 2 MG/ML IJ SOLN
0.5000 mg | Freq: Once | INTRAMUSCULAR | Status: AC
  Administered 2016-04-27: 0.5 mg via INTRAVENOUS
  Filled 2016-04-27: qty 1

## 2016-04-27 MED ORDER — IBUPROFEN 800 MG PO TABS
800.0000 mg | ORAL_TABLET | Freq: Once | ORAL | Status: AC
  Administered 2016-04-27: 800 mg via ORAL
  Filled 2016-04-27: qty 1

## 2016-04-27 MED ORDER — MORPHINE SULFATE (PF) 4 MG/ML IV SOLN
4.0000 mg | Freq: Once | INTRAVENOUS | Status: AC
  Administered 2016-04-27: 4 mg via INTRAVENOUS
  Filled 2016-04-27: qty 1

## 2016-04-27 MED ORDER — CYCLOBENZAPRINE HCL 10 MG PO TABS: 10.0000 mg | ORAL_TABLET | Freq: Three times a day (TID) | ORAL | 0 refills | Status: DC

## 2016-04-27 MED ORDER — IBUPROFEN 600 MG PO TABS: 600.0000 mg | ORAL_TABLET | Freq: Four times a day (QID) | ORAL | 0 refills | Status: DC | PRN

## 2016-04-27 MED ORDER — HYDROCODONE-ACETAMINOPHEN 5-325 MG PO TABS
2.0000 | ORAL_TABLET | Freq: Once | ORAL | Status: AC
  Administered 2016-04-27: 2 via ORAL
  Filled 2016-04-27: qty 2

## 2016-04-27 MED ORDER — ONDANSETRON HCL 4 MG/2ML IJ SOLN
4.0000 mg | Freq: Once | INTRAMUSCULAR | Status: AC
  Administered 2016-04-27: 4 mg via INTRAVENOUS
  Filled 2016-04-27: qty 2
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
## 2016-04-27 NOTE — ED Notes (Signed)
Patient transported to CT 

## 2016-04-27 NOTE — Discharge Instructions (Signed)
Your vital signs are within normal limits. Your CT scans and xrays are negative for fracture or dislocation. Please use the sling and ice pack for soreness of the forearm and elbow area. Use ibuprofen with each meal and at bedtime. Use flexeril three times daily for spasm pain. See your MD at the [facility] for additional management if not improving.
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
## 2017-03-25 ENCOUNTER — Other Ambulatory Visit: Payer: Self-pay | Admitting: Family Medicine

## 2017-03-25 DIAGNOSIS — B1920 Unspecified viral hepatitis C without hepatic coma: Secondary | ICD-10-CM

## 2017-04-01 ENCOUNTER — Encounter (HOSPITAL_COMMUNITY): Payer: Self-pay

## 2017-04-01 ENCOUNTER — Ambulatory Visit (HOSPITAL_COMMUNITY): Payer: Medicaid Other | Attending: Family Medicine

## 2017-04-01 ENCOUNTER — Other Ambulatory Visit: Payer: Self-pay

## 2017-04-01 DIAGNOSIS — M5441 Lumbago with sciatica, right side: Secondary | ICD-10-CM | POA: Diagnosis present

## 2017-04-01 DIAGNOSIS — R293 Abnormal posture: Secondary | ICD-10-CM | POA: Diagnosis present

## 2017-04-01 DIAGNOSIS — M5442 Lumbago with sciatica, left side: Secondary | ICD-10-CM | POA: Insufficient documentation

## 2017-04-01 DIAGNOSIS — R2689 Other abnormalities of gait and mobility: Secondary | ICD-10-CM

## 2017-04-01 DIAGNOSIS — G8929 Other chronic pain: Secondary | ICD-10-CM | POA: Diagnosis present
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
## 2017-04-01 NOTE — Patient Instructions (Signed)
   Single Knee to Chest with Belt: 10 times 10 seconds  While lying on your back, hold your knee with a belt or towel and gently pull it up towards your chest.

## 2017-04-08 ENCOUNTER — Ambulatory Visit (HOSPITAL_COMMUNITY): Payer: Medicaid Other | Attending: Family Medicine

## 2017-04-09 ENCOUNTER — Ambulatory Visit (HOSPITAL_COMMUNITY): Payer: Medicaid Other

## 2017-04-09 ENCOUNTER — Telehealth (HOSPITAL_COMMUNITY): Payer: Self-pay

## 2017-04-09 NOTE — Telephone Encounter (Signed)
I called the patient to discuss his appointment for tomorrow morning at 8:15 AM. His medicaid authorization has not been approved at this time and we need to cancel the appointment. Chase Hurley was agreeable to this and he currently has an appointment scheduled for Thursday at 9:45 AM and Friday at 1:45 PM. He would like to keep these two appointments. I informed he we will call if he has not been approved at that time but if he does not hear from us to come in for his appointment Thursday.  [name], PT, DPT Physical Therapist with [facility]  04/09/2017 5:23 PM

## 2017-04-09 NOTE — Telephone Encounter (Signed)
I called the patient to discuss his appointment this afternoon. His medicaid authorization has not been approved at this time and we need to cancel and reschedule the appointment. Mr. Chase Hurley was agreeable to this and I offered him an appointment for this Thursday at 9:45 AM. He would like to schedule an appointment then and I informed he we will call if he has not been approved at that time but if he does not hear from us to come in for his appointment Thursday.  [name], PT, DPT Physical Therapist with [facility]  04/09/2017 11:55 AM

## 2017-04-10 ENCOUNTER — Ambulatory Visit (HOSPITAL_COMMUNITY): Payer: Medicaid Other

## 2017-04-11 ENCOUNTER — Telehealth (HOSPITAL_COMMUNITY): Payer: Self-pay

## 2017-04-11 ENCOUNTER — Telehealth (HOSPITAL_COMMUNITY): Payer: Self-pay | Admitting: General Practice

## 2017-04-11 ENCOUNTER — Ambulatory Visit (HOSPITAL_COMMUNITY): Payer: Medicaid Other

## 2017-04-11 NOTE — Telephone Encounter (Signed)
04/11/17  I called to cx [facility]

## 2017-04-11 NOTE — Telephone Encounter (Signed)
Sent Staff Message to RQ -do we have approval from Medicaid yet? NF 04/11/17

## 2017-04-12 ENCOUNTER — Encounter (HOSPITAL_COMMUNITY): Payer: Medicaid Other | Admitting: Physical Therapy

## 2017-04-12 ENCOUNTER — Telehealth (HOSPITAL_COMMUNITY): Payer: Self-pay | Admitting: General Practice

## 2017-04-12 NOTE — Telephone Encounter (Signed)
04/12/17  I called patient to cx today's appt because we still didn't have approval from Medicaid.  When I called he said today wasn't a good day because he had a death in his family

## 2017-04-15 ENCOUNTER — Ambulatory Visit (HOSPITAL_COMMUNITY): Payer: Medicaid Other

## 2017-04-15 ENCOUNTER — Telehealth (HOSPITAL_COMMUNITY): Payer: Self-pay

## 2017-04-15 NOTE — Telephone Encounter (Signed)
No Show # 1: I called and spoke to the patient to inform him we had an appointment scheduled for 8:15 this morning. He stated he meant to call and cancel as he has a funeral to attend today. I offered my condolences and reminded him of his next appointment this Thursday, 04/18/17, at 9:45 AM.   [name], PT, DPT Physical Therapist with [facility]  04/15/2017 8:35 AM

## 2017-04-18 ENCOUNTER — Ambulatory Visit (HOSPITAL_COMMUNITY): Payer: Medicaid Other

## 2017-04-18 NOTE — Telephone Encounter (Signed)
No show, called and spoke to pt concerning missed apt.  Pt stated he is not feeling good today.  Pt educated on no show policy as well as insurance coverage for 3 units until 4/17.  reminded next apt date and time with contact info given.  [address]Casey Cockerham, LPTA; CBIS [phone]

## 2017-04-23 ENCOUNTER — Encounter (HOSPITAL_COMMUNITY): Payer: Medicaid Other

## 2017-04-26 ENCOUNTER — Ambulatory Visit (HOSPITAL_COMMUNITY): Payer: Medicaid Other | Admitting: Physical Therapy

## 2017-04-26 ENCOUNTER — Encounter (HOSPITAL_COMMUNITY): Payer: Self-pay | Admitting: Physical Therapy

## 2017-04-26 ENCOUNTER — Telehealth (HOSPITAL_COMMUNITY): Payer: Self-pay | Admitting: Physical Therapy

## 2017-04-26 NOTE — Therapy (Signed)
[place], [place], [address] Phone: [phone]   Fax:  [phone]  Patient Details  Name: Chase Hurley MRN: [id number] Date of Birth: May 23, 1970 Referring Provider:  No ref. provider found  Encounter Date: 04/26/2017  PHYSICAL THERAPY DISCHARGE SUMMARY  Visits from Start of Care: 1  Current functional level related to goals / functional outcomes: Patient attended the evaluation session only and therefore it is unclear where patient's progress was toward goals and what his functional level was.    Remaining deficits: Patient attended the evaluation session only and therefore it is unclear where patient's progress was toward goals and what his remaining deficits are.    Education / Equipment: Patient was provided a home exercise at evaluation. Patient was educated on attendance policy over the phone and confirmed understanding.  Plan: Patient agrees to discharge.  Patient goals were not met.although it is unclear as patient did not return after initial evaluation Patient is being discharged due to not returning since the last visit.  ????? and due to the attendance policy          Clarene Critchley PT, DPT 9:28 AM, 04/26/17 [place], [place], [address] Phone: [phone]   Fax:  [phone]

## 2017-04-26 NOTE — Telephone Encounter (Signed)
Therapist called patient and asked if everything was alright since we missed him for his appointment that was scheduled for 9 this morning. Patient stated that his ride to therapy had not shown up. Therapist informed the patient that unfortunately per the attendance policy patient would need to be discharged at this time as this was his third no-show. Patient reported understanding, and stated that he would have his physician write him a new referral for physical therapy at next appointment.   Chase CarrowMacy Banner Huckaba PT, DPT 9:23 AM, 04/26/17 [phone]

## 2017-04-29 ENCOUNTER — Encounter (HOSPITAL_COMMUNITY): Payer: Medicaid Other

## 2017-05-01 ENCOUNTER — Encounter (HOSPITAL_COMMUNITY): Payer: Medicaid Other

## 2017-05-16 ENCOUNTER — Ambulatory Visit (HOSPITAL_COMMUNITY): Admission: RE | Admit: 2017-05-16 | Payer: Medicaid Other | Source: Ambulatory Visit

## 2017-05-22 ENCOUNTER — Encounter (HOSPITAL_COMMUNITY): Payer: Self-pay | Admitting: Emergency Medicine

## 2017-05-22 ENCOUNTER — Emergency Department (HOSPITAL_COMMUNITY)
Admission: EM | Admit: 2017-05-22 | Discharge: 2017-05-23 | Disposition: A | Discharge status: Home / Self Care | Payer: Medicaid Other | Attending: Emergency Medicine | Admitting: Emergency Medicine

## 2017-05-22 ENCOUNTER — Emergency Department (HOSPITAL_COMMUNITY): Payer: Medicaid Other

## 2017-05-22 DIAGNOSIS — Y939 Activity, unspecified: Secondary | ICD-10-CM | POA: Diagnosis not present

## 2017-05-22 DIAGNOSIS — S71141A Puncture wound with foreign body, right thigh, initial encounter: Secondary | ICD-10-CM | POA: Diagnosis not present

## 2017-05-22 DIAGNOSIS — Y929 Unspecified place or not applicable: Secondary | ICD-10-CM | POA: Diagnosis not present

## 2017-05-22 DIAGNOSIS — S79921A Unspecified injury of right thigh, initial encounter: Secondary | ICD-10-CM | POA: Diagnosis present

## 2017-05-22 DIAGNOSIS — R600 Localized edema: Secondary | ICD-10-CM | POA: Diagnosis not present

## 2017-05-22 DIAGNOSIS — Z23 Encounter for immunization: Secondary | ICD-10-CM | POA: Insufficient documentation

## 2017-05-22 DIAGNOSIS — Y999 Unspecified external cause status: Secondary | ICD-10-CM | POA: Diagnosis not present

## 2017-05-22 DIAGNOSIS — F17200 Nicotine dependence, unspecified, uncomplicated: Secondary | ICD-10-CM | POA: Diagnosis not present

## 2017-05-22 DIAGNOSIS — W3400XA Accidental discharge from unspecified firearms or gun, initial encounter: Secondary | ICD-10-CM | POA: Diagnosis not present

## 2017-05-22 DIAGNOSIS — S71041A Puncture wound with foreign body, right hip, initial encounter: Secondary | ICD-10-CM | POA: Insufficient documentation

## 2017-05-22 LAB — I-STAT CHEM 8, ED
BUN: 5 mg/dL — AB (ref 6–20)
CALCIUM ION: 0.98 mmol/L — AB (ref 1.15–1.40)
CHLORIDE: 109 mmol/L (ref 101–111)
Creatinine, Ser: 1.1 mg/dL (ref 0.61–1.24)
GLUCOSE: 167 mg/dL — AB (ref 65–99)
HCT: 44 % (ref 39.0–52.0)
Hemoglobin: 15 g/dL (ref 13.0–17.0)
Potassium: 3.4 mmol/L — ABNORMAL LOW (ref 3.5–5.1)
SODIUM: 145 mmol/L (ref 135–145)
TCO2: 20 mmol/L — AB (ref 22–32)

## 2017-05-22 LAB — CBC
HCT: 44.6 % (ref 39.0–52.0)
Hemoglobin: 15.5 g/dL (ref 13.0–17.0)
MCH: 32 pg (ref 26.0–34.0)
MCHC: 34.8 g/dL (ref 30.0–36.0)
MCV: 92 fL (ref 78.0–100.0)
PLATELETS: 181 10*3/uL (ref 150–400)
RBC: 4.85 MIL/uL (ref 4.22–5.81)
RDW: 12.7 % (ref 11.5–15.5)
WBC: 10.3 10*3/uL (ref 4.0–10.5)

## 2017-05-22 LAB — PROTIME-INR
INR: 1.14
Prothrombin Time: 14.5 seconds (ref 11.4–15.2)

## 2017-05-22 LAB — I-STAT CG4 LACTIC ACID, ED: Lactic Acid, Venous: 2.26 mmol/L (ref 0.5–1.9)

## 2017-05-22 LAB — CDS SEROLOGY

## 2017-05-22 MED ORDER — TETANUS-DIPHTH-ACELL PERTUSSIS 5-2.5-18.5 LF-MCG/0.5 IM SUSP
0.5000 mL | Freq: Once | INTRAMUSCULAR | Status: AC
  Administered 2017-05-22: 0.5 mL via INTRAMUSCULAR
  Filled 2017-05-22: qty 0.5

## 2017-05-22 MED ORDER — HYDROMORPHONE HCL 2 MG/ML IJ SOLN
0.5000 mg | Freq: Once | INTRAMUSCULAR | Status: AC
  Administered 2017-05-22: 0.5 mg via INTRAVENOUS
  Filled 2017-05-22: qty 1

## 2017-05-22 MED ORDER — ONDANSETRON HCL 4 MG/2ML IJ SOLN
4.0000 mg | Freq: Once | INTRAMUSCULAR | Status: AC
  Administered 2017-05-22: 4 mg via INTRAVENOUS
  Filled 2017-05-22: qty 2
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
## 2017-05-23 ENCOUNTER — Encounter (HOSPITAL_COMMUNITY): Payer: Self-pay

## 2017-05-23 ENCOUNTER — Emergency Department (HOSPITAL_COMMUNITY): Payer: Medicaid Other

## 2017-05-23 LAB — SAMPLE TO BLOOD BANK

## 2017-05-23 LAB — COMPREHENSIVE METABOLIC PANEL
ALBUMIN: 3.6 g/dL (ref 3.5–5.0)
ALT: 20 U/L (ref 17–63)
ANION GAP: 8 (ref 5–15)
AST: 23 U/L (ref 15–41)
Alkaline Phosphatase: 65 U/L (ref 38–126)
BILIRUBIN TOTAL: 0.6 mg/dL (ref 0.3–1.2)
BUN: 6 mg/dL (ref 6–20)
CHLORIDE: 111 mmol/L (ref 101–111)
CO2: 21 mmol/L — ABNORMAL LOW (ref 22–32)
Calcium: 7.8 mg/dL — ABNORMAL LOW (ref 8.9–10.3)
Creatinine, Ser: 1.02 mg/dL (ref 0.61–1.24)
GFR calc Af Amer: 49 mL/min — ABNORMAL LOW (ref >60)
GFR calc non Af Amer: 43 mL/min — ABNORMAL LOW (ref >60)
GLUCOSE: 175 mg/dL — AB (ref 65–99)
POTASSIUM: 3.4 mmol/L — AB (ref 3.5–5.1)
SODIUM: 140 mmol/L (ref 135–145)
Total Protein: 6.4 g/dL — ABNORMAL LOW (ref 6.5–8.1)

## 2017-05-23 LAB — URINALYSIS, ROUTINE W REFLEX MICROSCOPIC
Bilirubin Urine: NEGATIVE
GLUCOSE, UA: NEGATIVE mg/dL
Hgb urine dipstick: NEGATIVE
Ketones, ur: NEGATIVE mg/dL
LEUKOCYTES UA: NEGATIVE
Nitrite: NEGATIVE
Protein, ur: NEGATIVE mg/dL
SPECIFIC GRAVITY, URINE: 1.027 (ref 1.005–1.030)
pH: 6 (ref 5.0–8.0)

## 2017-05-23 LAB — ETHANOL: ALCOHOL ETHYL (B): 227 mg/dL — AB (ref <10)

## 2017-05-23 MED ORDER — HYDROMORPHONE HCL 2 MG/ML IJ SOLN
1.0000 mg | Freq: Once | INTRAMUSCULAR | Status: AC
  Administered 2017-05-23: 1 mg via INTRAVENOUS
  Filled 2017-05-23: qty 1

## 2017-05-23 MED ORDER — NICOTINE 21 MG/24HR TD PT24
21.0000 mg | MEDICATED_PATCH | Freq: Once | TRANSDERMAL | Status: DC
  Administered 2017-05-23: 21 mg via TRANSDERMAL
  Filled 2017-05-23: qty 1

## 2017-05-23 MED ORDER — IOPAMIDOL (ISOVUE-370) INJECTION 76%
INTRAVENOUS | Status: AC
  Filled 2017-05-23: qty 100

## 2017-05-23 MED ORDER — OXYCODONE-ACETAMINOPHEN 5-325 MG PO TABS
2.0000 | ORAL_TABLET | Freq: Once | ORAL | Status: AC
  Administered 2017-05-23: 2 via ORAL
  Filled 2017-05-23: qty 2

## 2017-05-23 MED ORDER — OXYCODONE-ACETAMINOPHEN 5-325 MG PO TABS: 1.0000 | ORAL_TABLET | ORAL | 0 refills | Status: DC | PRN

## 2017-05-23 MED ORDER — IOPAMIDOL (ISOVUE-370) INJECTION 76%
100.0000 mL | Freq: Once | INTRAVENOUS | Status: AC | PRN
  Administered 2017-05-23: 100 mL via INTRAVENOUS
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
## 2017-05-23 NOTE — ED Notes (Signed)
Ortho at bedside.
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
## 2017-05-23 NOTE — ED Notes (Signed)
Patient transported to CT 

## 2017-05-24 ENCOUNTER — Other Ambulatory Visit: Payer: Self-pay

## 2017-05-24 ENCOUNTER — Inpatient Hospital Stay (HOSPITAL_COMMUNITY)
Admission: EM | Admit: 2017-05-24 | Discharge: 2017-05-26 | DRG: 603 | Disposition: A | Discharge status: Home / Self Care | Payer: Medicaid Other | Attending: Internal Medicine | Admitting: Internal Medicine

## 2017-05-24 ENCOUNTER — Encounter (HOSPITAL_COMMUNITY): Payer: Self-pay | Admitting: *Deleted

## 2017-05-24 ENCOUNTER — Ambulatory Visit: Payer: Medicaid Other

## 2017-05-24 ENCOUNTER — Other Ambulatory Visit (HOSPITAL_COMMUNITY): Payer: Self-pay | Admitting: Family Medicine

## 2017-05-24 DIAGNOSIS — M549 Dorsalgia, unspecified: Secondary | ICD-10-CM | POA: Diagnosis present

## 2017-05-24 DIAGNOSIS — E871 Hypo-osmolality and hyponatremia: Secondary | ICD-10-CM

## 2017-05-24 DIAGNOSIS — E869 Volume depletion, unspecified: Secondary | ICD-10-CM | POA: Diagnosis present

## 2017-05-24 DIAGNOSIS — F17210 Nicotine dependence, cigarettes, uncomplicated: Secondary | ICD-10-CM | POA: Diagnosis not present

## 2017-05-24 DIAGNOSIS — R600 Localized edema: Secondary | ICD-10-CM

## 2017-05-24 DIAGNOSIS — F1010 Alcohol abuse, uncomplicated: Secondary | ICD-10-CM | POA: Diagnosis not present

## 2017-05-24 DIAGNOSIS — T796XXA Traumatic ischemia of muscle, initial encounter: Secondary | ICD-10-CM

## 2017-05-24 DIAGNOSIS — Z720 Tobacco use: Secondary | ICD-10-CM

## 2017-05-24 DIAGNOSIS — Z91030 Bee allergy status: Secondary | ICD-10-CM

## 2017-05-24 DIAGNOSIS — L02415 Cutaneous abscess of right lower limb: Secondary | ICD-10-CM | POA: Diagnosis not present

## 2017-05-24 DIAGNOSIS — M6282 Rhabdomyolysis: Secondary | ICD-10-CM | POA: Diagnosis present

## 2017-05-24 DIAGNOSIS — L03115 Cellulitis of right lower limb: Secondary | ICD-10-CM | POA: Diagnosis not present

## 2017-05-24 DIAGNOSIS — B1920 Unspecified viral hepatitis C without hepatic coma: Secondary | ICD-10-CM

## 2017-05-24 DIAGNOSIS — G8929 Other chronic pain: Secondary | ICD-10-CM | POA: Diagnosis present

## 2017-05-24 DIAGNOSIS — S81841A Puncture wound with foreign body, right lower leg, initial encounter: Secondary | ICD-10-CM | POA: Diagnosis present

## 2017-05-24 DIAGNOSIS — T796XXD Traumatic ischemia of muscle, subsequent encounter: Secondary | ICD-10-CM | POA: Diagnosis not present

## 2017-05-24 DIAGNOSIS — W3301XA Accidental discharge of shotgun, initial encounter: Secondary | ICD-10-CM

## 2017-05-24 LAB — CBC WITH DIFFERENTIAL/PLATELET
Basophils Absolute: 0 10*3/uL (ref 0.0–0.1)
Basophils Relative: 0 %
Eosinophils Absolute: 0 10*3/uL (ref 0.0–0.7)
Eosinophils Relative: 0 %
HEMATOCRIT: 42.1 % (ref 39.0–52.0)
HEMOGLOBIN: 14.4 g/dL (ref 13.0–17.0)
LYMPHS PCT: 12 %
Lymphs Abs: 1.4 10*3/uL (ref 0.7–4.0)
MCH: 31.5 pg (ref 26.0–34.0)
MCHC: 34.2 g/dL (ref 30.0–36.0)
MCV: 92.1 fL (ref 78.0–100.0)
MONO ABS: 1.6 10*3/uL — AB (ref 0.1–1.0)
MONOS PCT: 13 %
NEUTROS ABS: 8.9 10*3/uL — AB (ref 1.7–7.7)
NEUTROS PCT: 75 %
Platelets: 150 10*3/uL (ref 150–400)
RBC: 4.57 MIL/uL (ref 4.22–5.81)
RDW: 12.1 % (ref 11.5–15.5)
WBC: 11.9 10*3/uL — ABNORMAL HIGH (ref 4.0–10.5)

## 2017-05-24 LAB — BASIC METABOLIC PANEL
ANION GAP: 7 (ref 5–15)
BUN: 8 mg/dL (ref 6–20)
CALCIUM: 8.5 mg/dL — AB (ref 8.9–10.3)
CHLORIDE: 101 mmol/L (ref 101–111)
CO2: 25 mmol/L (ref 22–32)
CREATININE: 0.82 mg/dL (ref 0.61–1.24)
GFR calc Af Amer: >60 mL/min (ref >60)
GFR calc non Af Amer: >60 mL/min (ref >60)
GLUCOSE: 120 mg/dL — AB (ref 65–99)
Potassium: 3.9 mmol/L (ref 3.5–5.1)
Sodium: 133 mmol/L — ABNORMAL LOW (ref 135–145)

## 2017-05-24 LAB — CK: Total CK: 2031 U/L — ABNORMAL HIGH (ref 49–397)

## 2017-05-24 LAB — RAPID URINE DRUG SCREEN, HOSP PERFORMED
Amphetamines: NOT DETECTED
BARBITURATES: NOT DETECTED
Benzodiazepines: NOT DETECTED
COCAINE: NOT DETECTED
Opiates: POSITIVE — AB
Tetrahydrocannabinol: POSITIVE — AB

## 2017-05-24 LAB — PROTIME-INR
INR: 1.18
Prothrombin Time: 14.9 seconds (ref 11.4–15.2)

## 2017-05-24 LAB — APTT: APTT: 37 s — AB (ref 24–36)

## 2017-05-24 MED ORDER — ONDANSETRON HCL 4 MG PO TABS: 4.0000 mg | ORAL_TABLET | Freq: Four times a day (QID) | ORAL | Status: DC | PRN

## 2017-05-24 MED ORDER — VITAMIN B-1 100 MG PO TABS
100.0000 mg | ORAL_TABLET | Freq: Every day | ORAL | Status: DC
  Administered 2017-05-24 – 2017-05-26 (×3): 100 mg via ORAL
  Filled 2017-05-24 (×3): qty 1

## 2017-05-24 MED ORDER — HYDROMORPHONE HCL 1 MG/ML IJ SOLN
1.0000 mg | Freq: Once | INTRAMUSCULAR | Status: AC
  Administered 2017-05-24: 1 mg via INTRAVENOUS
  Filled 2017-05-24: qty 1

## 2017-05-24 MED ORDER — ONDANSETRON HCL 4 MG/2ML IJ SOLN: 4.0000 mg | Freq: Four times a day (QID) | INTRAMUSCULAR | Status: DC | PRN

## 2017-05-24 MED ORDER — THIAMINE HCL 100 MG/ML IJ SOLN: 100.0000 mg | Freq: Every day | INTRAMUSCULAR | Status: DC

## 2017-05-24 MED ORDER — SODIUM CHLORIDE 0.9 % IV BOLUS
1000.0000 mL | Freq: Once | INTRAVENOUS | Status: AC
  Administered 2017-05-24: 1000 mL via INTRAVENOUS

## 2017-05-24 MED ORDER — ACETAMINOPHEN 650 MG RE SUPP: 650.0000 mg | Freq: Four times a day (QID) | RECTAL | Status: DC | PRN

## 2017-05-24 MED ORDER — VANCOMYCIN HCL 10 G IV SOLR
2000.0000 mg | Freq: Once | INTRAVENOUS | Status: AC
  Administered 2017-05-24: 2000 mg via INTRAVENOUS
  Filled 2017-05-24: qty 2000

## 2017-05-24 MED ORDER — CEFAZOLIN SODIUM-DEXTROSE 2-4 GM/100ML-% IV SOLN
2.0000 g | Freq: Three times a day (TID) | INTRAVENOUS | Status: DC
  Administered 2017-05-24 – 2017-05-26 (×5): 2 g via INTRAVENOUS
  Filled 2017-05-24 (×11): qty 100

## 2017-05-24 MED ORDER — LORAZEPAM 2 MG/ML IJ SOLN
1.0000 mg | Freq: Four times a day (QID) | INTRAMUSCULAR | Status: DC | PRN
  Administered 2017-05-25: 1 mg via INTRAVENOUS
  Filled 2017-05-24: qty 1

## 2017-05-24 MED ORDER — KETOROLAC TROMETHAMINE 30 MG/ML IJ SOLN
30.0000 mg | Freq: Four times a day (QID) | INTRAMUSCULAR | Status: AC
  Administered 2017-05-24 – 2017-05-25 (×5): 30 mg via INTRAVENOUS
  Filled 2017-05-24 (×5): qty 1

## 2017-05-24 MED ORDER — NICOTINE 21 MG/24HR TD PT24
21.0000 mg | MEDICATED_PATCH | Freq: Every day | TRANSDERMAL | Status: DC
  Administered 2017-05-24 – 2017-05-26 (×3): 21 mg via TRANSDERMAL
  Filled 2017-05-24 (×3): qty 1

## 2017-05-24 MED ORDER — ONDANSETRON HCL 4 MG/2ML IJ SOLN
4.0000 mg | Freq: Once | INTRAMUSCULAR | Status: AC
  Administered 2017-05-24: 4 mg via INTRAVENOUS
  Filled 2017-05-24: qty 2

## 2017-05-24 MED ORDER — FOLIC ACID 1 MG PO TABS
1.0000 mg | ORAL_TABLET | Freq: Every day | ORAL | Status: DC
  Administered 2017-05-24 – 2017-05-26 (×3): 1 mg via ORAL
  Filled 2017-05-24 (×3): qty 1

## 2017-05-24 MED ORDER — ADULT MULTIVITAMIN W/MINERALS CH
1.0000 | ORAL_TABLET | Freq: Every day | ORAL | Status: DC
Start: 2017-05-24 — End: 2017-05-26
  Administered 2017-05-24 – 2017-05-26 (×3): 1 via ORAL
  Filled 2017-05-24 (×3): qty 1

## 2017-05-24 MED ORDER — OXYCODONE-ACETAMINOPHEN 5-325 MG PO TABS
1.0000 | ORAL_TABLET | ORAL | Status: DC | PRN
  Administered 2017-05-24 – 2017-05-25 (×3): 1 via ORAL
  Filled 2017-05-24 (×3): qty 1

## 2017-05-24 MED ORDER — POLYETHYLENE GLYCOL 3350 17 G PO PACK
17.0000 g | PACK | Freq: Every day | ORAL | Status: DC | PRN
  Administered 2017-05-25: 17 g via ORAL
  Filled 2017-05-24: qty 1

## 2017-05-24 MED ORDER — HYDROMORPHONE HCL 1 MG/ML IJ SOLN
1.0000 mg | Freq: Once | INTRAMUSCULAR | Status: AC
  Administered 2017-05-24: 1 mg via INTRAVENOUS

## 2017-05-24 MED ORDER — HYDROMORPHONE HCL 1 MG/ML IJ SOLN
INTRAMUSCULAR | Status: AC
  Administered 2017-05-24: 1 mg via INTRAVENOUS
  Filled 2017-05-24: qty 1

## 2017-05-24 MED ORDER — ACETAMINOPHEN 325 MG PO TABS: 650.0000 mg | ORAL_TABLET | Freq: Four times a day (QID) | ORAL | Status: DC | PRN

## 2017-05-24 MED ORDER — LORAZEPAM 1 MG PO TABS
1.0000 mg | ORAL_TABLET | Freq: Four times a day (QID) | ORAL | Status: DC | PRN
  Administered 2017-05-25 (×2): 1 mg via ORAL
  Filled 2017-05-24 (×2): qty 1
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
## 2017-05-24 NOTE — ED Notes (Signed)
Family, Debbie Wyatt [phone]
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
## 2017-05-24 NOTE — ED Notes (Signed)
Pt placed in room and hooked up to monitor
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
## 2017-05-24 NOTE — Progress Notes (Signed)
Pharmacy Note:  Initial antibiotic(s) regimen of Vancomycin ordered by EDP to treat Cellulitis  Estimated Creatinine Clearance: 141.1 mL/min (by C-G formula based on SCr of 0.82 mg/dL).   Allergies  Allergen Reactions  . Bee Venom Swelling    Vitals:   05/24/17 1433 05/24/17 1631  BP:  127/73  Pulse: 92 89  Resp:  18  Temp:    SpO2: 95% 92%    Anti-infectives (From admission, onward)   Start     Dose/Rate Route Frequency Ordered Stop   05/24/17 1300  vancomycin (VANCOCIN) 2,000 mg in sodium chloride 0.9 % 500 mL IVPB     2,000 mg 250 mL/hr over 120 Minutes Intravenous  Once 05/24/17 1225 05/24/17 1552      Plan: Initial dose(s) of Vancomycin 2gm IV X 1 ordered. F/U admission orders for further dosing if therapy continued.  Tera Materoole, Magdalene Tardiff L, [facility] LLCRPH 05/24/2017 5:23 PM

## 2017-05-25 ENCOUNTER — Observation Stay (HOSPITAL_COMMUNITY): Payer: Medicaid Other

## 2017-05-25 DIAGNOSIS — F17210 Nicotine dependence, cigarettes, uncomplicated: Secondary | ICD-10-CM | POA: Diagnosis present

## 2017-05-25 DIAGNOSIS — Z720 Tobacco use: Secondary | ICD-10-CM | POA: Diagnosis not present

## 2017-05-25 DIAGNOSIS — E871 Hypo-osmolality and hyponatremia: Secondary | ICD-10-CM | POA: Diagnosis present

## 2017-05-25 DIAGNOSIS — S81841A Puncture wound with foreign body, right lower leg, initial encounter: Secondary | ICD-10-CM | POA: Diagnosis present

## 2017-05-25 DIAGNOSIS — T796XXD Traumatic ischemia of muscle, subsequent encounter: Secondary | ICD-10-CM | POA: Diagnosis not present

## 2017-05-25 DIAGNOSIS — T796XXA Traumatic ischemia of muscle, initial encounter: Secondary | ICD-10-CM | POA: Diagnosis present

## 2017-05-25 DIAGNOSIS — Z91030 Bee allergy status: Secondary | ICD-10-CM | POA: Diagnosis not present

## 2017-05-25 DIAGNOSIS — E869 Volume depletion, unspecified: Secondary | ICD-10-CM | POA: Diagnosis present

## 2017-05-25 DIAGNOSIS — M6282 Rhabdomyolysis: Secondary | ICD-10-CM | POA: Diagnosis present

## 2017-05-25 DIAGNOSIS — F1010 Alcohol abuse, uncomplicated: Secondary | ICD-10-CM | POA: Diagnosis present

## 2017-05-25 DIAGNOSIS — G8929 Other chronic pain: Secondary | ICD-10-CM | POA: Diagnosis present

## 2017-05-25 DIAGNOSIS — L03115 Cellulitis of right lower limb: Secondary | ICD-10-CM | POA: Diagnosis present

## 2017-05-25 DIAGNOSIS — M549 Dorsalgia, unspecified: Secondary | ICD-10-CM | POA: Diagnosis present

## 2017-05-25 DIAGNOSIS — W3301XA Accidental discharge of shotgun, initial encounter: Secondary | ICD-10-CM | POA: Diagnosis not present

## 2017-05-25 LAB — BASIC METABOLIC PANEL
Anion gap: 9 (ref 5–15)
BUN: 11 mg/dL (ref 6–20)
CHLORIDE: 100 mmol/L — AB (ref 101–111)
CO2: 27 mmol/L (ref 22–32)
CREATININE: 0.9 mg/dL (ref 0.61–1.24)
Calcium: 8.5 mg/dL — ABNORMAL LOW (ref 8.9–10.3)
GFR calc Af Amer: >60 mL/min (ref >60)
GFR calc non Af Amer: >60 mL/min (ref >60)
GLUCOSE: 144 mg/dL — AB (ref 65–99)
POTASSIUM: 3.6 mmol/L (ref 3.5–5.1)
SODIUM: 136 mmol/L (ref 135–145)

## 2017-05-25 LAB — CBC
HEMATOCRIT: 38 % — AB (ref 39.0–52.0)
Hemoglobin: 12.6 g/dL — ABNORMAL LOW (ref 13.0–17.0)
MCH: 30.6 pg (ref 26.0–34.0)
MCHC: 33.2 g/dL (ref 30.0–36.0)
MCV: 92.2 fL (ref 78.0–100.0)
PLATELETS: 145 10*3/uL — AB (ref 150–400)
RBC: 4.12 MIL/uL — ABNORMAL LOW (ref 4.22–5.81)
RDW: 12 % (ref 11.5–15.5)
WBC: 7.9 10*3/uL (ref 4.0–10.5)

## 2017-05-25 LAB — CK: Total CK: 1668 U/L — ABNORMAL HIGH (ref 49–397)

## 2017-05-25 LAB — HIV ANTIBODY (ROUTINE TESTING W REFLEX): HIV SCREEN 4TH GENERATION: NONREACTIVE

## 2017-05-25 MED ORDER — OXYCODONE-ACETAMINOPHEN 5-325 MG PO TABS
1.0000 | ORAL_TABLET | ORAL | Status: DC | PRN
  Administered 2017-05-25 – 2017-05-26 (×6): 2 via ORAL
  Filled 2017-05-25 (×6): qty 2

## 2017-05-25 MED ORDER — SENNA 8.6 MG PO TABS
2.0000 | ORAL_TABLET | Freq: Every day | ORAL | Status: DC
  Administered 2017-05-25 – 2017-05-26 (×2): 17.2 mg via ORAL
  Filled 2017-05-25 (×2): qty 2

## 2017-05-25 MED ORDER — DOCUSATE SODIUM 100 MG PO CAPS
100.0000 mg | ORAL_CAPSULE | Freq: Two times a day (BID) | ORAL | Status: DC
  Administered 2017-05-25 – 2017-05-26 (×3): 100 mg via ORAL
  Filled 2017-05-25 (×3): qty 1

## 2017-05-25 MED ORDER — KETOROLAC TROMETHAMINE 30 MG/ML IJ SOLN
30.0000 mg | Freq: Four times a day (QID) | INTRAMUSCULAR | Status: DC
  Administered 2017-05-25 – 2017-05-26 (×2): 30 mg via INTRAVENOUS
  Filled 2017-05-25 (×2): qty 1

## 2017-05-25 MED ORDER — POTASSIUM CHLORIDE IN NACL 20-0.9 MEQ/L-% IV SOLN
INTRAVENOUS | Status: DC
  Administered 2017-05-25: 12:00:00 via INTRAVENOUS
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
## 2017-05-26 LAB — CBC
HEMATOCRIT: 33.6 % — AB (ref 39.0–52.0)
HEMOGLOBIN: 11.4 g/dL — AB (ref 13.0–17.0)
MCH: 31.1 pg (ref 26.0–34.0)
MCHC: 33.9 g/dL (ref 30.0–36.0)
MCV: 91.8 fL (ref 78.0–100.0)
Platelets: 157 10*3/uL (ref 150–400)
RBC: 3.66 MIL/uL — ABNORMAL LOW (ref 4.22–5.81)
RDW: 12 % (ref 11.5–15.5)
WBC: 5.6 10*3/uL (ref 4.0–10.5)

## 2017-05-26 LAB — BASIC METABOLIC PANEL
Anion gap: 9 (ref 5–15)
BUN: 13 mg/dL (ref 6–20)
CHLORIDE: 104 mmol/L (ref 101–111)
CO2: 27 mmol/L (ref 22–32)
Calcium: 8.6 mg/dL — ABNORMAL LOW (ref 8.9–10.3)
Creatinine, Ser: 0.86 mg/dL (ref 0.61–1.24)
GFR calc Af Amer: >60 mL/min (ref >60)
GFR calc non Af Amer: >60 mL/min (ref >60)
GLUCOSE: 119 mg/dL — AB (ref 65–99)
POTASSIUM: 3.7 mmol/L (ref 3.5–5.1)
Sodium: 140 mmol/L (ref 135–145)

## 2017-05-26 LAB — MAGNESIUM: MAGNESIUM: 2 mg/dL (ref 1.7–2.4)

## 2017-05-26 LAB — CK: Total CK: 1389 U/L — ABNORMAL HIGH (ref 49–397)

## 2017-05-26 MED ORDER — CEPHALEXIN 500 MG PO CAPS: 500.0000 mg | ORAL_CAPSULE | Freq: Four times a day (QID) | ORAL | 0 refills | Status: AC

## 2017-05-26 MED ORDER — OXYCODONE-ACETAMINOPHEN 5-325 MG PO TABS: 1.0000 | ORAL_TABLET | ORAL | 0 refills | Status: DC | PRN

## 2017-05-26 MED ORDER — NICOTINE 21 MG/24HR TD PT24
21.0000 mg | MEDICATED_PATCH | Freq: Every day | TRANSDERMAL | Status: DC
  Administered 2017-05-26: 21 mg via TRANSDERMAL

## 2017-05-26 MED ORDER — NICOTINE 21 MG/24HR TD PT24
21.0000 mg | MEDICATED_PATCH | Freq: Every day | TRANSDERMAL | Status: DC
  Filled 2017-05-26: qty 1

## 2017-05-26 NOTE — Progress Notes (Signed)
Patient IV removed tolerated well, patient given discharge instructions at bedside.

## 2017-05-30 ENCOUNTER — Encounter (HOSPITAL_COMMUNITY): Payer: Self-pay | Admitting: Emergency Medicine

## 2017-05-30 ENCOUNTER — Other Ambulatory Visit: Payer: Self-pay

## 2017-05-30 ENCOUNTER — Emergency Department (HOSPITAL_COMMUNITY)
Admission: EM | Admit: 2017-05-30 | Discharge: 2017-05-30 | Disposition: A | Discharge status: Home / Self Care | Payer: Medicaid Other | Attending: Emergency Medicine | Admitting: Emergency Medicine

## 2017-05-30 ENCOUNTER — Emergency Department (HOSPITAL_COMMUNITY): Payer: Medicaid Other

## 2017-05-30 DIAGNOSIS — S81831D Puncture wound without foreign body, right lower leg, subsequent encounter: Secondary | ICD-10-CM

## 2017-05-30 DIAGNOSIS — S8981XD Other specified injuries of right lower leg, subsequent encounter: Secondary | ICD-10-CM | POA: Insufficient documentation

## 2017-05-30 DIAGNOSIS — Z5189 Encounter for other specified aftercare: Secondary | ICD-10-CM

## 2017-05-30 DIAGNOSIS — F17210 Nicotine dependence, cigarettes, uncomplicated: Secondary | ICD-10-CM | POA: Diagnosis not present

## 2017-05-30 DIAGNOSIS — Z79899 Other long term (current) drug therapy: Secondary | ICD-10-CM | POA: Insufficient documentation

## 2017-05-30 DIAGNOSIS — Z4801 Encounter for change or removal of surgical wound dressing: Secondary | ICD-10-CM | POA: Diagnosis present

## 2017-05-30 DIAGNOSIS — W3400XD Accidental discharge from unspecified firearms or gun, subsequent encounter: Secondary | ICD-10-CM | POA: Diagnosis not present

## 2017-05-30 LAB — CBC WITH DIFFERENTIAL/PLATELET
Basophils Absolute: 0 10*3/uL (ref 0.0–0.1)
Basophils Relative: 0 %
Eosinophils Absolute: 0.1 10*3/uL (ref 0.0–0.7)
Eosinophils Relative: 1 %
HCT: 36.7 % — ABNORMAL LOW (ref 39.0–52.0)
Hemoglobin: 12.5 g/dL — ABNORMAL LOW (ref 13.0–17.0)
Lymphocytes Relative: 15 %
Lymphs Abs: 1.2 10*3/uL (ref 0.7–4.0)
MCH: 31.6 pg (ref 26.0–34.0)
MCHC: 34.1 g/dL (ref 30.0–36.0)
MCV: 92.9 fL (ref 78.0–100.0)
Monocytes Absolute: 0.8 10*3/uL (ref 0.1–1.0)
Monocytes Relative: 10 %
Neutro Abs: 5.6 10*3/uL (ref 1.7–7.7)
Neutrophils Relative %: 74 %
Platelets: 278 10*3/uL (ref 150–400)
RBC: 3.95 MIL/uL — ABNORMAL LOW (ref 4.22–5.81)
RDW: 12 % (ref 11.5–15.5)
WBC: 7.6 10*3/uL (ref 4.0–10.5)

## 2017-05-30 LAB — BASIC METABOLIC PANEL
Anion gap: 9 (ref 5–15)
BUN: 13 mg/dL (ref 6–20)
CO2: 25 mmol/L (ref 22–32)
Calcium: 9.1 mg/dL (ref 8.9–10.3)
Chloride: 103 mmol/L (ref 101–111)
Creatinine, Ser: 0.88 mg/dL (ref 0.61–1.24)
GFR calc Af Amer: >60 mL/min (ref >60)
GFR calc non Af Amer: >60 mL/min (ref >60)
Glucose, Bld: 107 mg/dL — ABNORMAL HIGH (ref 65–99)
Potassium: 4.1 mmol/L (ref 3.5–5.1)
Sodium: 137 mmol/L (ref 135–145)

## 2017-05-30 MED ORDER — IOPAMIDOL (ISOVUE-300) INJECTION 61%
75.0000 mL | Freq: Once | INTRAVENOUS | Status: AC | PRN
  Administered 2017-05-30: 75 mL via INTRAVENOUS

## 2017-05-30 MED ORDER — OXYCODONE-ACETAMINOPHEN 5-325 MG PO TABS: 1.0000 | ORAL_TABLET | ORAL | 0 refills | Status: DC | PRN

## 2017-05-30 MED ORDER — KETOROLAC TROMETHAMINE 30 MG/ML IJ SOLN
15.0000 mg | Freq: Once | INTRAMUSCULAR | Status: AC
  Administered 2017-05-30: 15 mg via INTRAVENOUS
  Filled 2017-05-30: qty 1

## 2017-05-30 MED ORDER — HYDROMORPHONE HCL 1 MG/ML IJ SOLN
1.0000 mg | Freq: Once | INTRAMUSCULAR | Status: AC
  Administered 2017-05-30: 1 mg via INTRAVENOUS
  Filled 2017-05-30: qty 1

## 2017-05-30 MED ORDER — SODIUM CHLORIDE 0.9 % IV SOLN
2.0000 g | Freq: Once | INTRAVENOUS | Status: AC
  Administered 2017-05-30: 2 g via INTRAVENOUS
  Filled 2017-05-30: qty 20

## 2017-05-30 MED ORDER — LACTATED RINGERS IV BOLUS
1000.0000 mL | Freq: Once | INTRAVENOUS | Status: AC
  Administered 2017-05-30: 1000 mL via INTRAVENOUS

## 2017-05-30 MED ORDER — LORAZEPAM 2 MG/ML IJ SOLN
1.0000 mg | Freq: Once | INTRAMUSCULAR | Status: AC
  Administered 2017-05-30: 1 mg via INTRAVENOUS
  Filled 2017-05-30: qty 1

## 2017-05-30 MED ORDER — CYCLOBENZAPRINE HCL 10 MG PO TABS: 10.0000 mg | ORAL_TABLET | Freq: Three times a day (TID) | ORAL | 0 refills | Status: DC | PRN

## 2017-05-30 MED ORDER — CEPHALEXIN 500 MG PO CAPS
500.0000 mg | ORAL_CAPSULE | Freq: Three times a day (TID) | ORAL | 0 refills | Status: DC
Start: 2017-05-30 — End: 2017-06-06
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
## 2017-05-30 NOTE — ED Notes (Signed)
Patient refused discharge vital signs. 
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
## 2017-06-03 ENCOUNTER — Telehealth: Payer: Self-pay | Admitting: Orthopedic Surgery
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
## 2017-06-06 ENCOUNTER — Ambulatory Visit: Payer: Medicaid Other | Admitting: Orthopedic Surgery

## 2017-06-06 VITALS — BP 109/67 | HR 88 | Ht 69.0 in | Wt 242.0 lb

## 2017-06-06 DIAGNOSIS — S81032D Puncture wound without foreign body, left knee, subsequent encounter: Secondary | ICD-10-CM

## 2017-06-06 DIAGNOSIS — W3400XD Accidental discharge from unspecified firearms or gun, subsequent encounter: Secondary | ICD-10-CM

## 2017-06-06 MED ORDER — CEPHALEXIN 500 MG PO CAPS: 500.0000 mg | ORAL_CAPSULE | Freq: Three times a day (TID) | ORAL | 0 refills | Status: AC

## 2017-06-06 MED ORDER — OXYCODONE-ACETAMINOPHEN 5-325 MG PO TABS: 1.0000 | ORAL_TABLET | ORAL | 0 refills | Status: AC | PRN

## 2017-06-06 MED ORDER — CEPHALEXIN 500 MG PO CAPS: 500.0000 mg | ORAL_CAPSULE | Freq: Three times a day (TID) | ORAL | 0 refills | Status: DC
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
## 2017-06-07 ENCOUNTER — Ambulatory Visit (HOSPITAL_COMMUNITY)
Admission: RE | Admit: 2017-06-07 | Discharge: 2017-06-07 | Disposition: A | Discharge status: Home / Self Care | Payer: Medicaid Other | Source: Ambulatory Visit | Attending: Family Medicine | Admitting: Family Medicine

## 2017-06-07 DIAGNOSIS — B1920 Unspecified viral hepatitis C without hepatic coma: Secondary | ICD-10-CM | POA: Insufficient documentation

## 2017-06-07 DIAGNOSIS — K8020 Calculus of gallbladder without cholecystitis without obstruction: Secondary | ICD-10-CM | POA: Insufficient documentation

## 2017-06-17 ENCOUNTER — Encounter (HOSPITAL_COMMUNITY): Payer: Self-pay

## 2017-06-17 ENCOUNTER — Ambulatory Visit (HOSPITAL_COMMUNITY): Payer: Medicaid Other | Attending: Family Medicine

## 2017-06-17 DIAGNOSIS — M79604 Pain in right leg: Secondary | ICD-10-CM
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
## 2017-06-19 ENCOUNTER — Ambulatory Visit (HOSPITAL_COMMUNITY): Payer: Medicaid Other

## 2017-06-21 ENCOUNTER — Ambulatory Visit (HOSPITAL_COMMUNITY): Payer: Medicaid Other

## 2017-06-21 ENCOUNTER — Encounter (HOSPITAL_COMMUNITY): Payer: Self-pay

## 2017-06-21 DIAGNOSIS — M79604 Pain in right leg: Secondary | ICD-10-CM | POA: Diagnosis not present
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
## 2017-06-26 ENCOUNTER — Telehealth (HOSPITAL_COMMUNITY): Payer: Self-pay

## 2017-06-26 ENCOUNTER — Ambulatory Visit (HOSPITAL_COMMUNITY): Payer: Medicaid Other

## 2017-06-26 NOTE — Telephone Encounter (Signed)
No show, called and left message about missed apt.  Informed pt that today was last Medicaid approval day and unsure if further authorization will be approved for apt scheduled for tomorrow.  included contact info for questions concerning.    [address], LPTA; CBIS [phone]

## 2017-06-27 ENCOUNTER — Telehealth (HOSPITAL_COMMUNITY): Payer: Self-pay | Admitting: Family Medicine

## 2017-06-27 ENCOUNTER — Inpatient Hospital Stay (HOSPITAL_COMMUNITY)
Admission: RE | Admit: 2017-06-27 | Discharge: 2017-06-27 | Disposition: A | Discharge status: Home / Self Care | Payer: Medicaid Other | Source: Ambulatory Visit

## 2017-06-27 ENCOUNTER — Ambulatory Visit (HOSPITAL_COMMUNITY): Payer: Medicaid Other

## 2017-06-27 NOTE — Progress Notes (Signed)
Not here for PAT appointment message left for him to return call.

## 2017-06-27 NOTE — Pre-Procedure Instructions (Signed)
Chase Hurley  06/27/2017      [facility] [place] [place] - [address] [place] [address] Phone: [phone] Fax: [phone]  [facility] [address] [place] [address] [place] [address] [place] [address] [place] [address] Phone: [phone] Fax: [phone]    Your procedure is scheduled on June 5  Report to [facility] Admitting at 1045 A.M.  Call this number if you have problems the morning of surgery:  [phone]   Remember:  No food or drink after midnight.      Take these medicines the morning of surgery with A SIP OF WATER Albuterol inhaler if needed- bring with you on the day of surgery Oxycodone (Percocet) if needed  Stop taking aspirin, BC's, Goody's, Herbal medications, Fish Oil, Aleve, Naproxen,  Ibuprofen, Advil, Motrin, vitamins,     Do not wear jewelry, make-up or nail polish.  Do not wear lotions, powders, or perfumes, or deodorant.  Do not shave 48 hours prior to surgery.  Men may shave face and neck.  Do not bring valuables to the hospital.  [facility] is not responsible for any belongings or valuables.  Contacts, dentures or bridgework may not be worn into surgery.  Leave your suitcase in the car.  After surgery it may be brought to your room.  For patients admitted to the hospital, discharge time will be determined by your treatment team.  Patients discharged the day of surgery will not be allowed to drive home.   Special instructions:   [facility]- Preparing For Surgery  Before surgery, you can play an important role. Because skin is not sterile, your skin needs to be as free of germs as possible. You can reduce the number of germs on your skin by washing with CHG (chlorahexidine gluconate) Soap before surgery.  CHG is an antiseptic cleaner which kills germs and bonds with the skin to continue killing germs even after washing.    Oral Hygiene is also important to reduce your risk of  infection.  Remember - BRUSH YOUR TEETH THE MORNING OF SURGERY WITH YOUR REGULAR TOOTHPASTE  Please do not use if you have an allergy to CHG or antibacterial soaps. If your skin becomes reddened/irritated stop using the CHG.  Do not shave (including legs and underarms) for at least 48 hours prior to first CHG shower. It is OK to shave your face.  Please follow these instructions carefully.   1. Shower the NIGHT BEFORE SURGERY and the MORNING OF SURGERY with CHG.   2. If you chose to wash your hair, wash your hair first as usual with your normal shampoo.  3. After you shampoo, rinse your hair and body thoroughly to remove the shampoo.  4. Use CHG as you would any other liquid soap. You can apply CHG directly to the skin and wash gently with a scrungie or a clean washcloth.   5. Apply the CHG Soap to your body ONLY FROM THE NECK DOWN.  Do not use on open wounds or open sores. Avoid contact with your eyes, ears, mouth and genitals (private parts). Wash Face and genitals (private parts)  with your normal soap.  6. Wash thoroughly, paying special attention to the area where your surgery will be performed.  7. Thoroughly rinse your body with warm water from the neck down.  8. DO NOT shower/wash with your normal soap after using and rinsing off the CHG Soap.  9.  Pat yourself dry with a CLEAN TOWEL.  10. Wear CLEAN PAJAMAS to bed the night before surgery, wear comfortable clothes the morning of surgery  11. Place CLEAN SHEETS on your bed the night of your first shower and DO NOT SLEEP WITH PETS.    Day of Surgery:  Do not apply any deodorants/lotions.  Please wear clean clothes to the hospital/surgery center.   Remember to brush your teeth WITH YOUR REGULAR TOOTHPASTE.    Please read over the following fact sheets that you were given. Pain Booklet, Coughing and Deep Breathing and Surgical Site Infection Prevention, incentive Spirometry

## 2017-06-27 NOTE — Progress Notes (Signed)
Mr Mcferran returned call and will need to be rescheduled

## 2017-06-27 NOTE — Telephone Encounter (Signed)
06/27/17  his visits ran out 5/29 and I called patient to let him know we would resubmit and if we got visits approved we would call him back.

## 2017-07-02 ENCOUNTER — Other Ambulatory Visit: Payer: Self-pay

## 2017-07-02 ENCOUNTER — Encounter (HOSPITAL_COMMUNITY): Payer: Self-pay | Admitting: *Deleted

## 2017-07-02 NOTE — Progress Notes (Signed)
PCP is Dr Lloyd HugerShasser, [place], [place].

## 2017-07-03 ENCOUNTER — Ambulatory Visit (HOSPITAL_COMMUNITY)
Admission: RE | Admit: 2017-07-03 | Discharge: 2017-07-03 | Disposition: A | Discharge status: Home / Self Care | Payer: Medicaid Other | Source: Ambulatory Visit | Attending: Orthopedic Surgery | Admitting: Orthopedic Surgery

## 2017-07-03 ENCOUNTER — Encounter (HOSPITAL_COMMUNITY): Payer: Self-pay | Admitting: Urology

## 2017-07-03 ENCOUNTER — Encounter (HOSPITAL_COMMUNITY)
Admission: RE | Disposition: A | Discharge status: Home / Self Care | Payer: Self-pay | Source: Ambulatory Visit | Attending: Orthopedic Surgery

## 2017-07-03 ENCOUNTER — Ambulatory Visit (HOSPITAL_COMMUNITY): Payer: Medicaid Other | Admitting: Anesthesiology

## 2017-07-03 DIAGNOSIS — W3301XA Accidental discharge of shotgun, initial encounter: Secondary | ICD-10-CM | POA: Insufficient documentation

## 2017-07-03 DIAGNOSIS — S81041A Puncture wound with foreign body, right knee, initial encounter: Secondary | ICD-10-CM | POA: Diagnosis present

## 2017-07-03 DIAGNOSIS — M659 Synovitis and tenosynovitis, unspecified: Secondary | ICD-10-CM | POA: Diagnosis not present

## 2017-07-03 DIAGNOSIS — F17210 Nicotine dependence, cigarettes, uncomplicated: Secondary | ICD-10-CM | POA: Diagnosis not present

## 2017-07-03 HISTORY — DX: Dyspnea, unspecified: R06.00

## 2017-07-03 HISTORY — DX: Inflammatory liver disease, unspecified: K75.9

## 2017-07-03 HISTORY — PX: KNEE ARTHROSCOPY: SHX127

## 2017-07-03 HISTORY — DX: Personal history of urinary calculi: Z87.442

## 2017-07-03 LAB — CBC
HCT: 47.8 % (ref 39.0–52.0)
Hemoglobin: 16 g/dL (ref 13.0–17.0)
MCH: 31.5 pg (ref 26.0–34.0)
MCHC: 33.5 g/dL (ref 30.0–36.0)
MCV: 94.1 fL (ref 78.0–100.0)
Platelets: 189 10*3/uL (ref 150–400)
RBC: 5.08 MIL/uL (ref 4.22–5.81)
RDW: 12.1 % (ref 11.5–15.5)
WBC: 6.2 10*3/uL (ref 4.0–10.5)

## 2017-07-03 LAB — BASIC METABOLIC PANEL
Anion gap: 8 (ref 5–15)
BUN: 11 mg/dL (ref 6–20)
CALCIUM: 9.3 mg/dL (ref 8.9–10.3)
CO2: 24 mmol/L (ref 22–32)
Chloride: 108 mmol/L (ref 101–111)
Creatinine, Ser: 0.75 mg/dL (ref 0.61–1.24)
GFR calc Af Amer: >60 mL/min (ref >60)
GLUCOSE: 89 mg/dL (ref 65–99)
Potassium: 3.7 mmol/L (ref 3.5–5.1)
Sodium: 140 mmol/L (ref 135–145)

## 2017-07-03 SURGERY — ARTHROSCOPY, KNEE
Anesthesia: General | Site: Knee | Laterality: Right

## 2017-07-03 MED ORDER — CHLORHEXIDINE GLUCONATE 4 % EX LIQD: 60.0000 mL | Freq: Once | CUTANEOUS | Status: DC

## 2017-07-03 MED ORDER — HYDROMORPHONE HCL 2 MG/ML IJ SOLN
INTRAMUSCULAR | Status: AC
  Filled 2017-07-03: qty 1

## 2017-07-03 MED ORDER — CEFAZOLIN SODIUM-DEXTROSE 2-4 GM/100ML-% IV SOLN
2.0000 g | INTRAVENOUS | Status: AC
  Administered 2017-07-03: 2 g via INTRAVENOUS
  Filled 2017-07-03: qty 100

## 2017-07-03 MED ORDER — FENTANYL CITRATE (PF) 100 MCG/2ML IJ SOLN
INTRAMUSCULAR | Status: DC | PRN
  Administered 2017-07-03: 100 ug via INTRAVENOUS
  Administered 2017-07-03 (×3): 50 ug via INTRAVENOUS

## 2017-07-03 MED ORDER — PROMETHAZINE HCL 25 MG/ML IJ SOLN: 6.2500 mg | INTRAMUSCULAR | Status: DC | PRN

## 2017-07-03 MED ORDER — MIDAZOLAM HCL 2 MG/2ML IJ SOLN
INTRAMUSCULAR | Status: DC | PRN
  Administered 2017-07-03: 2 mg via INTRAVENOUS

## 2017-07-03 MED ORDER — FENTANYL CITRATE (PF) 100 MCG/2ML IJ SOLN
INTRAMUSCULAR | Status: AC
  Filled 2017-07-03: qty 2

## 2017-07-03 MED ORDER — PROPOFOL 10 MG/ML IV BOLUS
INTRAVENOUS | Status: DC | PRN
  Administered 2017-07-03: 50 mg via INTRAVENOUS
  Administered 2017-07-03: 200 mg via INTRAVENOUS

## 2017-07-03 MED ORDER — OXYCODONE HCL 5 MG PO TABS: 5.0000 mg | ORAL_TABLET | Freq: Once | ORAL | Status: DC | PRN

## 2017-07-03 MED ORDER — PROPOFOL 10 MG/ML IV BOLUS
INTRAVENOUS | Status: AC
  Filled 2017-07-03: qty 20

## 2017-07-03 MED ORDER — DEXAMETHASONE SODIUM PHOSPHATE 10 MG/ML IJ SOLN
INTRAMUSCULAR | Status: DC | PRN
  Administered 2017-07-03: 4 mg via INTRAVENOUS

## 2017-07-03 MED ORDER — SODIUM CHLORIDE 0.9 % IR SOLN
Status: DC | PRN
  Administered 2017-07-03: 6000 mL

## 2017-07-03 MED ORDER — ONDANSETRON HCL 4 MG/2ML IJ SOLN
INTRAMUSCULAR | Status: DC | PRN
  Administered 2017-07-03: 4 mg via INTRAVENOUS

## 2017-07-03 MED ORDER — DEXAMETHASONE SODIUM PHOSPHATE 10 MG/ML IJ SOLN
INTRAMUSCULAR | Status: AC
  Filled 2017-07-03: qty 1

## 2017-07-03 MED ORDER — FENTANYL CITRATE (PF) 100 MCG/2ML IJ SOLN
100.0000 ug | Freq: Once | INTRAMUSCULAR | Status: AC
  Administered 2017-07-03: 100 ug via INTRAVENOUS

## 2017-07-03 MED ORDER — MIDAZOLAM HCL 2 MG/2ML IJ SOLN
INTRAMUSCULAR | Status: AC
  Filled 2017-07-03: qty 2

## 2017-07-03 MED ORDER — LIDOCAINE HCL (CARDIAC) PF 100 MG/5ML IV SOSY
PREFILLED_SYRINGE | INTRAVENOUS | Status: DC | PRN
  Administered 2017-07-03: 80 mg via INTRAVENOUS

## 2017-07-03 MED ORDER — ALBUTEROL SULFATE HFA 108 (90 BASE) MCG/ACT IN AERS
INHALATION_SPRAY | RESPIRATORY_TRACT | Status: AC
  Filled 2017-07-03: qty 6.7

## 2017-07-03 MED ORDER — LACTATED RINGERS IV SOLN
INTRAVENOUS | Status: DC
  Administered 2017-07-03: 11:00:00 via INTRAVENOUS

## 2017-07-03 MED ORDER — MEPERIDINE HCL 50 MG/ML IJ SOLN: 6.2500 mg | INTRAMUSCULAR | Status: DC | PRN

## 2017-07-03 MED ORDER — FENTANYL CITRATE (PF) 250 MCG/5ML IJ SOLN
INTRAMUSCULAR | Status: AC
  Filled 2017-07-03: qty 5

## 2017-07-03 MED ORDER — ONDANSETRON 4 MG PO TBDP: 4.0000 mg | ORAL_TABLET | Freq: Three times a day (TID) | ORAL | 0 refills | Status: DC | PRN

## 2017-07-03 MED ORDER — HYDROMORPHONE HCL 2 MG PO TABS: 2.0000 mg | ORAL_TABLET | ORAL | 0 refills | Status: AC | PRN

## 2017-07-03 MED ORDER — PHENYLEPHRINE 40 MCG/ML (10ML) SYRINGE FOR IV PUSH (FOR BLOOD PRESSURE SUPPORT)
PREFILLED_SYRINGE | INTRAVENOUS | Status: AC
  Filled 2017-07-03: qty 10

## 2017-07-03 MED ORDER — HYDROMORPHONE HCL 2 MG/ML IJ SOLN
0.2500 mg | INTRAMUSCULAR | Status: DC | PRN
  Administered 2017-07-03 (×4): 0.5 mg via INTRAVENOUS

## 2017-07-03 MED ORDER — EPHEDRINE SULFATE 50 MG/ML IJ SOLN
INTRAMUSCULAR | Status: AC
  Filled 2017-07-03: qty 1

## 2017-07-03 MED ORDER — OXYCODONE HCL 5 MG/5ML PO SOLN: 5.0000 mg | Freq: Once | ORAL | Status: DC | PRN

## 2017-07-03 SURGICAL SUPPLY — 29 items
ABLATOR ASPIRATE 50D MULTI-PRT (SURGICAL WAND) ×3 IMPLANT
ALCOHOL 70% 16 OZ (MISCELLANEOUS) ×3 IMPLANT
BANDAGE ACE 6X5 VEL STRL LF (GAUZE/BANDAGES/DRESSINGS) ×3 IMPLANT
BLADE CUTTER GATOR 3.5 (BLADE) ×3 IMPLANT
BLADE EXCALIBUR 4.0MM X 13CM (MISCELLANEOUS) ×1
BLADE EXCALIBUR 4.0X13 (MISCELLANEOUS) ×2 IMPLANT
BLADE SURG 10 STRL SS (BLADE) IMPLANT
COVER SURGICAL LIGHT HANDLE (MISCELLANEOUS) ×3 IMPLANT
DRAPE U-SHAPE 47X51 STRL (DRAPES) ×3 IMPLANT
DURAPREP 26ML APPLICATOR (WOUND CARE) ×3 IMPLANT
GAUZE SPONGE 4X4 12PLY STRL (GAUZE/BANDAGES/DRESSINGS) ×3 IMPLANT
GAUZE SPONGE 4X4 12PLY STRL LF (GAUZE/BANDAGES/DRESSINGS) ×3 IMPLANT
GAUZE SPONGE 4X4 16PLY XRAY LF (GAUZE/BANDAGES/DRESSINGS) ×3 IMPLANT
GAUZE XEROFORM 1X8 LF (GAUZE/BANDAGES/DRESSINGS) ×3 IMPLANT
GLOVE BIO SURGEON STRL SZ7.5 (GLOVE) ×3 IMPLANT
GLOVE BIOGEL PI IND STRL 8 (GLOVE) ×1 IMPLANT
GLOVE BIOGEL PI INDICATOR 8 (GLOVE) ×2
GOWN STRL REUS W/TWL LRG LVL3 (GOWN DISPOSABLE) ×6 IMPLANT
GOWN STRL REUS W/TWL XL LVL3 (GOWN DISPOSABLE) IMPLANT
KIT BASIN OR (CUSTOM PROCEDURE TRAY) ×3 IMPLANT
MANIFOLD NEPTUNE II (INSTRUMENTS) ×3 IMPLANT
PACK ARTHROSCOPY DSU (CUSTOM PROCEDURE TRAY) ×3 IMPLANT
PAD ABD 8X10 STRL (GAUZE/BANDAGES/DRESSINGS) ×3 IMPLANT
PADDING CAST COTTON 6X4 STRL (CAST SUPPLIES) ×3 IMPLANT
SUT ETHILON 4 0 PS 2 18 (SUTURE) ×3 IMPLANT
SYR 30ML LL (SYRINGE) ×3 IMPLANT
TOWEL OR 17X26 10 PK STRL BLUE (TOWEL DISPOSABLE) ×3 IMPLANT
TUBING ARTHROSCOPY IRRIG 16FT (MISCELLANEOUS) ×3 IMPLANT
WRAP KNEE MAXI GEL POST OP (GAUZE/BANDAGES/DRESSINGS) ×3 IMPLANT

## 2017-07-03 NOTE — Anesthesia Procedure Notes (Signed)
Procedure Name: LMA Insertion Date/Time: 07/03/2017 2:05 PM Performed by: Chase Hurley, Chase Funches L, CRNA Pre-anesthesia Checklist: Patient identified, Emergency Drugs available, Suction available and Patient being monitored Patient Re-evaluated:Patient Re-evaluated prior to induction Oxygen Delivery Method: Circle System Utilized Preoxygenation: Pre-oxygenation with 100% oxygen Induction Type: IV induction Ventilation: Mask ventilation without difficulty LMA: LMA inserted LMA Size: 5.0 Number of attempts: 1 Airway Equipment and Method: Bite block Placement Confirmation: positive ETCO2 Tube secured with: Tape Dental Injury: Teeth and Oropharynx as per pre-operative assessment

## 2017-07-03 NOTE — Anesthesia Preprocedure Evaluation (Signed)
Anesthesia Evaluation  Patient identified by MRN, date of birth, ID band Patient awake    Reviewed: Allergy & Precautions, NPO status , Patient's Chart, lab work & pertinent test results  Airway Mallampati: II  TM Distance: >3 FB Neck ROM: Full    Dental no notable dental hx.    Pulmonary neg pulmonary ROS, Current Smoker,    Pulmonary exam normal breath sounds clear to auscultation       Cardiovascular negative cardio ROS Normal cardiovascular exam Rhythm:Regular Rate:Normal     Neuro/Psych negative neurological ROS  negative psych ROS   GI/Hepatic negative GI ROS, Neg liver ROS,   Endo/Other  negative endocrine ROS  Renal/GU negative Renal ROS  negative genitourinary   Musculoskeletal negative musculoskeletal ROS (+)   Abdominal   Peds negative pediatric ROS (+)  Hematology negative hematology ROS (+)   Anesthesia Other Findings   Reproductive/Obstetrics negative OB ROS                             Anesthesia Physical Anesthesia Plan  ASA: II  Anesthesia Plan: General   Post-op Pain Management:    Induction: Intravenous  PONV Risk Score and Plan: 1 and Ondansetron  Airway Management Planned: LMA  Additional Equipment:   Intra-op Plan:   Post-operative Plan: Extubation in OR  Informed Consent: I have reviewed the patients History and Physical, chart, labs and discussed the procedure including the risks, benefits and alternatives for the proposed anesthesia with the patient or authorized representative who has indicated his/her understanding and acceptance.     Dental advisory given  Plan Discussed with: CRNA  Anesthesia Plan Comments:        Anesthesia Quick Evaluation  

## 2017-07-03 NOTE — Transfer of Care (Signed)
Immediate Anesthesia Transfer of Care Note  Patient: Chase CantorDaniel A Hurley  Procedure(s) Performed: Right knee arthroscopy with limited synovectomy and foreign body removal (Right Knee)  Patient Location: PACU  Anesthesia Type:General  Level of Consciousness: awake and alert   Airway & Oxygen Therapy: Patient Spontanous Breathing and Patient connected to nasal cannula oxygen  Post-op Assessment: Report given to RN, Post -op Vital signs reviewed and stable and Patient moving all extremities X 4  Post vital signs: Reviewed and stable  Last Vitals:  Vitals Value Taken Time  BP 106/76 07/03/2017  3:17 PM  Temp 36.2 C 07/03/2017  3:16 PM  Pulse 69 07/03/2017  3:21 PM  Resp 16 07/03/2017  3:21 PM  SpO2 96 % 07/03/2017  3:21 PM  Vitals shown include unvalidated device data.  Last Pain:  Vitals:   07/03/17 1231  TempSrc:   PainSc: 10-Worst pain ever      Patients Stated Pain Goal: 0 (07/03/17 1100)  Complications: No apparent anesthesia complications

## 2017-07-03 NOTE — Discharge Instructions (Signed)
Discharge instructions: -Okay for full weightbearing as tolerated to right leg -Elevate leg and apply ice to the right knee for 30 minutes out of every hour for the next 2 to 3 days. -For the prevention of blood clots take a 325 mg aspirin once daily for 6 weeks. -For mild to moderate pain use Tylenol and/or Advil as needed.  For breakthrough pain use oxycodone as needed. -You may begin ranging the knee as tolerated with flexion and extension immediately postoperatively. Maintain your postoperative bandages for 3 days.  He may remove them on the third day after surgery and begin showering at that time.  Do not submerge underwater until your follow-up appointment.-  -Follow-up with Dr. Aundria Rudogers in 2 weeks for suture removal and wound check.
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
## 2017-07-03 NOTE — Brief Op Note (Signed)
07/03/2017  2:58 PM  PATIENT:  Abran Cantoraniel A Grieb  47 y.o. male  PRE-OPERATIVE DIAGNOSIS:  Foreign metallic bodies right knee  POST-OPERATIVE DIAGNOSIS:  Foreign metallic bodies right knee  PROCEDURE:  Procedure(s) with comments: Right knee arthroscopy with limited synovectomy and foreign body removal (Right) - 60 mins  SURGEON:  Surgeon(s) and Role:    * Aundria Rudogers, Noah DelaineJason Patrick, MD - Primary  PHYSICIAN ASSISTANT:   ASSISTANTS: none   ANESTHESIA:   general  EBL:  10 mL   BLOOD ADMINISTERED:none  DRAINS: none   LOCAL MEDICATIONS USED:  NONE  SPECIMEN:  No Specimen  DISPOSITION OF SPECIMEN:  N/A  COUNTS:  YES  TOURNIQUET:  * Missing tourniquet times found for documented tourniquets in log: [id number] *  DICTATION: .Note written in EPIC  PLAN OF CARE: Discharge to home after PACU  PATIENT DISPOSITION:  PACU - hemodynamically stable.   Delay start of Pharmacological VTE agent (>24hrs) due to surgical blood loss or risk of bleeding: not applicable

## 2017-07-04 ENCOUNTER — Encounter (HOSPITAL_COMMUNITY): Payer: Self-pay | Admitting: Orthopedic Surgery

## 2017-07-04 ENCOUNTER — Telehealth (HOSPITAL_COMMUNITY): Payer: Self-pay | Admitting: Family Medicine

## 2017-07-04 NOTE — Telephone Encounter (Signed)
07/04/17  Left a message to let patient know that 12 PT visits have been approved  6/04-04-12-2019

## 2017-07-04 NOTE — Anesthesia Postprocedure Evaluation (Signed)
Anesthesia Post Note  Patient: Chase CantorDaniel A Hurley  Procedure(s) Performed: Right knee arthroscopy with limited synovectomy and foreign body removal (Right Knee)     Patient location during evaluation: PACU Anesthesia Type: General Level of consciousness: awake and alert Pain management: pain level controlled Vital Signs Assessment: post-procedure vital signs reviewed and stable Respiratory status: spontaneous breathing, nonlabored ventilation and respiratory function stable Cardiovascular status: blood pressure returned to baseline and stable Postop Assessment: no apparent nausea or vomiting Anesthetic complications: no    Last Vitals:  Vitals:   07/03/17 1603 07/03/17 1608  BP: (!) 117/39   Pulse: (!) 59   Resp: 15   Temp:  (!) 36.2 C  SpO2: 99%     Last Pain:  Vitals:   07/03/17 1545  TempSrc:   PainSc: 10-Worst pain ever                 Lowella CurbWarren Ray Jacquel Redditt

## 2017-07-08 ENCOUNTER — Telehealth (HOSPITAL_COMMUNITY): Payer: Self-pay

## 2017-07-08 ENCOUNTER — Ambulatory Visit (HOSPITAL_COMMUNITY): Payer: Medicaid Other

## 2017-07-08 NOTE — Telephone Encounter (Signed)
Pt called he doesn't want anyone to touch his leg today, need to give it a few more days.

## 2017-07-11 ENCOUNTER — Telehealth (HOSPITAL_COMMUNITY): Payer: Self-pay

## 2017-07-11 ENCOUNTER — Ambulatory Visit (HOSPITAL_COMMUNITY): Payer: Medicaid Other

## 2017-07-11 NOTE — Telephone Encounter (Signed)
Patient call to cancel he have some kind of bug

## 2017-07-15 ENCOUNTER — Ambulatory Visit (HOSPITAL_COMMUNITY): Payer: Medicaid Other | Attending: Family Medicine

## 2017-07-15 DIAGNOSIS — R293 Abnormal posture: Secondary | ICD-10-CM | POA: Insufficient documentation

## 2017-07-15 DIAGNOSIS — M5442 Lumbago with sciatica, left side: Secondary | ICD-10-CM | POA: Insufficient documentation

## 2017-07-15 DIAGNOSIS — M79604 Pain in right leg: Secondary | ICD-10-CM | POA: Insufficient documentation

## 2017-07-15 DIAGNOSIS — G8929 Other chronic pain: Secondary | ICD-10-CM | POA: Insufficient documentation

## 2017-07-15 DIAGNOSIS — M5441 Lumbago with sciatica, right side: Secondary | ICD-10-CM | POA: Insufficient documentation

## 2017-07-15 DIAGNOSIS — R2689 Other abnormalities of gait and mobility: Secondary | ICD-10-CM | POA: Insufficient documentation

## 2017-07-16 ENCOUNTER — Telehealth (HOSPITAL_COMMUNITY): Payer: Self-pay

## 2017-07-16 NOTE — Telephone Encounter (Signed)
I called Mr. Chase Hurley on his mobile phone number to check in as he missed his appointment yesterday. He states that no one called him to remind him and I asked if this was the best number to send our phone tree appointment reminder to, he stated it is. I informed him he has an appointment Thursday 07/18/17 at 1:00 PM and asked that he call if he cannot make it so we do not count it as a no show. He stated he would be there.  [name], PT, DPT Physical Therapist with [facility]  07/16/2017 11:47 AM

## 2017-07-18 ENCOUNTER — Encounter (HOSPITAL_COMMUNITY): Payer: Self-pay

## 2017-07-18 ENCOUNTER — Other Ambulatory Visit: Payer: Self-pay

## 2017-07-18 ENCOUNTER — Ambulatory Visit (HOSPITAL_COMMUNITY): Payer: Medicaid Other

## 2017-07-18 DIAGNOSIS — G8929 Other chronic pain: Secondary | ICD-10-CM | POA: Diagnosis present

## 2017-07-18 DIAGNOSIS — R293 Abnormal posture: Secondary | ICD-10-CM | POA: Diagnosis present

## 2017-07-18 DIAGNOSIS — M79604 Pain in right leg: Secondary | ICD-10-CM | POA: Diagnosis not present

## 2017-07-18 DIAGNOSIS — M5441 Lumbago with sciatica, right side: Secondary | ICD-10-CM | POA: Diagnosis present

## 2017-07-18 DIAGNOSIS — R2689 Other abnormalities of gait and mobility: Secondary | ICD-10-CM | POA: Diagnosis present

## 2017-07-18 DIAGNOSIS — M5442 Lumbago with sciatica, left side: Secondary | ICD-10-CM | POA: Diagnosis present
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
## 2017-07-22 ENCOUNTER — Encounter (HOSPITAL_COMMUNITY): Payer: Self-pay

## 2017-07-22 ENCOUNTER — Other Ambulatory Visit: Payer: Self-pay

## 2017-07-22 ENCOUNTER — Ambulatory Visit (HOSPITAL_COMMUNITY): Payer: Medicaid Other

## 2017-07-22 DIAGNOSIS — M79604 Pain in right leg: Secondary | ICD-10-CM

## 2017-07-22 NOTE — Therapy (Signed)
[facility] [address] [place], [place], [address] Phone: [phone]   Fax:  [phone]  Physical Therapy Treatment  Patient Details  Name: Chase CantorDaniel A Hurley MRN: [id number] Date of Birth: 06-08-1970 Referring Provider: Duwayne HeckJason Rogers, MD   Encounter Date: 07/22/2017  PT End of Session - 07/22/17 1355    Visit Number  4    Number of Visits  16    Date for PT Re-Evaluation  07/01/17    Authorization Type  Medicaid [place]    Authorization Time Period  Cert: 6/96-2/9/525/20-07/02/17; Medicaid prior authorization for 3 sessions: 05/23--> 5/29    Authorization - Visit Number  2    Authorization - Number of Visits  12    PT Start Time  1351    PT Stop Time  1440 6 minutes at EOS no billed (patient riding bike again to relieve stiffness)    PT Time Calculation (min)  49 min    Activity Tolerance  Patient tolerated treatment well;No increased pain    Behavior During Therapy  WFL for tasks assessed/performed       Past Medical History:  Diagnosis Date  . Dyspnea    with exertion  . Hepatitis    Hepatitis C  . History of kidney stones     Past Surgical History:  Procedure Laterality Date  . KNEE ARTHROSCOPY Right 07/03/2017   Procedure: Right knee arthroscopy with limited synovectomy and foreign body removal;  Surgeon: Yolonda Kidaogers, Jason Patrick, MD;  [facility];  Service: Orthopedics;  Laterality: Right;  60 mins    There were no vitals filed for this visit.  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 He states he was climbing up and down ladders throughout the day and that after work he did no ice as much as he should have. He reports he did do his exercises and that they helped him.     Limitations  Sitting;Lifting;Standing;Walking;House hold activities    Patient Stated Goals  have less  pain    Currently in Pain?  Yes    Pain Score  7     Pain Location  Knee    Pain Orientation  Right    Pain Descriptors / Indicators  Sharp    Pain Type  Surgical pain;Chronic pain    Pain Onset  More than a month ago    Pain Frequency  Constant    Aggravating Factors   everything makes it more sore    Pain Relieving Factors  water helps with pain    Effect of Pain on Daily Activities  moderate        [facility] Adult PT Treatment/Exercise - 07/22/17 0001      Knee/Hip Exercises: Stretches   Active Hamstring Stretch  Right;3 reps;30 seconds;Limitations    Active Hamstring Stretch Limitations  12" box, with AP pressure on thigh     Knee: Self-Stretch to increase Flexion  Right;3 reps;30 seconds;Limitations    Knee: Self-Stretch Limitations  12" box, knee drives    Gastroc Stretch  3 reps;30 seconds;Limitations    Gastroc Stretch Limitations  slant board      Knee/Hip Exercises: Aerobic   Stationary Bike  5 minutes for AROM, seat 14, full revolutions      Knee/Hip Exercises: Standing   Heel Raises  Both;2 sets;15 reps;3 seconds;Limitations    Heel Raises Limitations  2x 15 reps toe raises bil LE    Terminal Knee Extension  Strengthening;Right;2 sets;15 reps;Theraband;Limitations    Theraband Level (Terminal Knee Extension)  Level 4 (Blue)    Terminal Knee Extension Limitations  3 sec holds    Rocker Board  2 minutes 2x 1 minute, lateral for AROM      Knee/Hip Exercises: Supine   Knee Extension  AROM    Knee Flexion  AROM      Manual Therapy   Manual Therapy  Joint mobilization;Passive ROM    Manual therapy comments  performed seperate from other interventions    Joint Mobilization  3x 30-45 seconds grade III glide, A/P to Rt tibiofemoral joint in end range flexion    Passive ROM  performed between joint mobilization for 15 seconds        PT Education - 07/22/17 1359    Education provided  Yes    Education Details  Encouraged to perform HEP in pool. Edcuated on increase in  activity causing increased pain.     Person(s) Educated  Patient    Methods  Explanation    Comprehension  Verbalized understanding       PT Short Term Goals - 07/18/17 1308      PT SHORT TERM GOAL #1   Title  Patient will be independent with HEP to reduce pain , improve posture, and activity tolerance to increase function.    Time  3    Period  Weeks    Status  Achieved      PT SHORT TERM GOAL #2   Title  After three weeks patien twill demonstrate improve Right knee ROM , 8-105 degrees.     Baseline  07/18/17: 9-92 degrees    Time  3    Period  Weeks    Status  On-going      PT SHORT TERM GOAL #3   Title  Pt will report improved tolerance of sensation of right leg skin near heeling wounds.     Time  3    Period  Weeks    Status  On-going      PT SHORT TERM GOAL #4   Title  Pt will report improved toleranfce to sitting from 10 to 20 minutes.     Time  3    Period  Weeks    Status  Achieved      PT SHORT TERM GOAL #5   Title  After 3 weeks patient will demonstrate AMB of >519ft at self selected gait speed > 1.70m/s.     Baseline  07/18/17 - 1.16 m/s    Time  3    Period  Weeks    Status  Achieved        PT Long Term Goals - 07/18/17 1308      PT LONG TERM GOAL #1   Title  Patient will demonstrate proper lifting mechanics from floor to waist height with no increase in pain to improve overal function and performance of daily household activitties.     Time  6    Period  Weeks    Status  On-going      PT LONG TERM GOAL #2   Title  Patient will improve LEFS by 9 points to indidcate a clincally significant improvements in function and decrease in disability.     Time  6    Period  Weeks    Status  Revised  PT LONG TERM GOAL #3   Title  Patient will deny numbness and tingling distal to bil hips to demonstrate improved radicular symptoms and centrliaziaton of pain.    Time  6    Period  Weeks    Status  On-going      PT LONG TERM GOAL #4   Title  Patient will  report maximum of 3/10 pain with activity to improve QOL and reduce pain with daily acitvites such as driving, lifting, cleaning, and mowing the lawn.     Time  6    Period  Weeks    Status  On-going        Plan - 07/22/17 1356    Clinical Impression Statement  This session focused on ROM exercise at start and strengthening for Rt quad. He continued with bike and rockerboard for AROM and initiated TKE today without difficulty. He continues to be limited by anterior thigh sensitivity on Rt LE and will need further interventions for desensitization. He will benefit from ongoing skilled PT interventions to address current impairments and progress towards remaining goals.    Rehab Potential  Fair    PT Frequency  3x / week    PT Duration  3 weeks    PT Treatment/Interventions  ADLs/Self Care Home Management;Cryotherapy;Electrical Stimulation;Moist Heat;Traction;Gait training;Stair training;Functional mobility training;Therapeutic activities;Therapeutic exercise;Balance training;Neuromuscular re-education;Patient/family education;Manual techniques;Passive range of motion;Taping    PT Next Visit Plan  Focus on ROM exercises and joint mobilizations for flexion/extension. Continue with light scar massage and desensitization to anterior thigh and perform manual PRN for edema. Focus on Rt quad, hamstring, and gastroc strengthening. Discuss benefits of pool therapy to determine if patient would like to participate in Monday aquatic session.    PT Home Exercise Plan  heel slides, quad sets, sensory re-integration. 06/21/17: SAQ; 07/18/17 - heel raises, hamstring stretch, quad stretch    Consulted and Agree with Plan of Care  Patient       Patient will benefit from skilled therapeutic intervention in order to improve the following deficits and impairments:  Pain, Postural dysfunction, Increased muscle spasms, Decreased mobility, Decreased activity tolerance, Decreased range of motion, Decreased strength,  Impaired flexibility, Difficulty walking, Abnormal gait, Hypomobility, Decreased skin integrity, Decreased scar mobility, Increased fascial restricitons  Visit Diagnosis: Pain in right leg     Problem List Patient Active Problem List   Diagnosis Date Noted  . Rhabdomyolysis 05/25/2017  . Traumatic rhabdomyolysis (HCC) 05/25/2017  . Cellulitis and abscess of right leg 05/24/2017  . Alcohol abuse 05/24/2017  . Tobacco abuse 05/24/2017  . Hyponatremia 05/24/2017    Valentino Saxon, PT, DPT Physical Therapist with [facility]  07/22/2017 2:17 PM    [facility] [address] [place], [place], [address] Phone: [phone]   Fax:  [phone]  Name: PERLEY ARTHURS MRN: [id number] Date of Birth: 1970-02-06

## 2017-07-23 ENCOUNTER — Telehealth (HOSPITAL_COMMUNITY): Payer: Self-pay

## 2017-07-23 NOTE — Telephone Encounter (Signed)
I called Mr. Chase Hurley to discuss the option to participate in aquatic therapy to as he has mentioned the pool helps relieve his leg pain and our clinic will be starting an aquatic therapy program on July 1st. I left a message discussing this an informed him he did not need to return my call. I reminded him his next appointment is for this Thursday 6/27 at 1:45 PM and that we can discuss the aquatic therapy at that appointment.   [name], PT, DPT Physical Therapist with [facility]  07/23/2017 5:49 PM

## 2017-07-24 ENCOUNTER — Telehealth (HOSPITAL_COMMUNITY): Payer: Self-pay

## 2017-07-24 NOTE — Telephone Encounter (Signed)
Pt confirmed after phone tree call he will be here. NF

## 2017-07-25 ENCOUNTER — Encounter (HOSPITAL_COMMUNITY): Payer: Self-pay

## 2017-07-25 ENCOUNTER — Ambulatory Visit (HOSPITAL_COMMUNITY): Payer: Medicaid Other

## 2017-07-25 ENCOUNTER — Other Ambulatory Visit: Payer: Self-pay

## 2017-07-25 DIAGNOSIS — R2689 Other abnormalities of gait and mobility: Secondary | ICD-10-CM

## 2017-07-25 DIAGNOSIS — M5442 Lumbago with sciatica, left side: Secondary | ICD-10-CM

## 2017-07-25 DIAGNOSIS — R293 Abnormal posture: Secondary | ICD-10-CM

## 2017-07-25 DIAGNOSIS — M5441 Lumbago with sciatica, right side: Secondary | ICD-10-CM

## 2017-07-25 DIAGNOSIS — M79604 Pain in right leg: Secondary | ICD-10-CM | POA: Diagnosis not present

## 2017-07-25 DIAGNOSIS — G8929 Other chronic pain: Secondary | ICD-10-CM

## 2017-07-25 NOTE — Therapy (Signed)
[facility] [address] [place], [place], [address] Phone: [phone]   Fax:  [phone]  Physical Therapy Treatment  Patient Details  Name: Chase Hurley MRN: [id number] Date of Birth: 24-Jul-1970 Referring Provider: Duwayne Heck, MD   Encounter Date: 07/25/2017  PT End of Session - 07/25/17 1412    Visit Number  5    Number of Visits  16    Date for PT Re-Evaluation  07/01/17    Authorization Type  Medicaid     Authorization Time Period  Cert: 6/96-03/09/50; Medicaid prior authorization for 3 sessions: 05/23--> 5/29    Authorization - Visit Number  3    Authorization - Number of Visits  12    PT Start Time  1404 patient arrived late    PT Stop Time  1430    PT Time Calculation (min)  26 min    Activity Tolerance  Patient tolerated treatment well    Behavior During Therapy  WFL for tasks assessed/performed       Past Medical History:  Diagnosis Date  . Dyspnea    with exertion  . Hepatitis    Hepatitis C  . History of kidney stones     Past Surgical History:  Procedure Laterality Date  . KNEE ARTHROSCOPY Right 07/03/2017   Procedure: Right knee arthroscopy with limited synovectomy and foreign body removal;  Surgeon: Yolonda Kida, MD;  Location: [facility] OR;  Service: Orthopedics;  Laterality: Right;  60 mins    There were no vitals filed for this visit.  Subjective Assessment - 07/25/17 1405    Subjective  Patient reports he has a friend who has a stationary bike that he is going to start using for ROM. He reports his pain is pretty high today, as he did a lot of yard work today. He reports he got stung 3 times yesterday on his Rt leg but he is ok, he took benedryl and that helped.    Pertinent History  history of chronic back pain     Limitations  Sitting;Lifting;Standing;Walking;House hold activities    How long can you sit comfortably?  5-10 minutes     How long can you stand comfortably?  unlimited with weight  shifting    How long can you walk comfortably?  estimated about a half mile     Diagnostic tests  X-Ray, MRI    Patient Stated Goals  have less pain    Currently in Pain?  Yes    Pain Score  7     Pain Location  Knee    Pain Orientation  Right    Pain Descriptors / Indicators  Sharp    Pain Type  Surgical pain;Chronic pain    Pain Onset  More than a month ago    Pain Frequency  Constant    Aggravating Factors   everything    Pain Relieving Factors  water, pain medicine        [facility] Adult PT Treatment/Exercise - 07/25/17 0001      Knee/Hip Exercises: Stretches   Active Hamstring Stretch  Right;3 reps;30 seconds;Limitations    Active Hamstring Stretch Limitations  12" box, with AP pressure on thigh     Knee: Self-Stretch to increase Flexion  Right;3 reps;30 seconds;Limitations    Knee: Self-Stretch Limitations  12" box, knee drives      Knee/Hip Exercises: Aerobic   Stationary Bike  5 minutes for AROM, seat 14, full revolutions  Knee/Hip Exercises: Standing   Heel Raises  Both;2 sets;15 reps;3 seconds;Limitations    Heel Raises Limitations  2x 15 reps toe raises bil LE      Knee/Hip Exercises: Supine   Short Arc Quad Sets  Strengthening;Right;1 set;15 reps;Limitations    Short Arc Quad Sets Limitations  2 lbs    Straight Leg Raises  Strengthening;Right;1 set;15 reps;Limitations    Straight Leg Raises Limitations  quad set prior to SLR    Knee Extension  AROM    Knee Extension Limitations  5    Knee Flexion  AROM    Other Supine Knee/Hip Exercises  100        PT Education - 07/25/17 1411    Education provided  Yes    Education Details  Educated on aquatic therapy and where to go for session.    Person(s) Educated  Patient    Methods  Explanation;Handout    Comprehension  Verbalized understanding       PT Short Term Goals - 07/18/17 1308      PT SHORT TERM GOAL #1   Title  Patient will be independent with HEP to reduce pain , improve posture, and activity  tolerance to increase function.    Time  3    Period  Weeks    Status  Achieved      PT SHORT TERM GOAL #2   Title  After three weeks patien twill demonstrate improve Right knee ROM , 8-105 degrees.     Baseline  07/18/17: 9-92 degrees    Time  3    Period  Weeks    Status  On-going      PT SHORT TERM GOAL #3   Title  Pt will report improved tolerance of sensation of right leg skin near heeling wounds.     Time  3    Period  Weeks    Status  On-going      PT SHORT TERM GOAL #4   Title  Pt will report improved toleranfce to sitting from 10 to 20 minutes.     Time  3    Period  Weeks    Status  Achieved      PT SHORT TERM GOAL #5   Title  After 3 weeks patient will demonstrate AMB of >57700ft at self selected gait speed > 1.5258m/s.     Baseline  07/18/17 - 1.16 m/s    Time  3    Period  Weeks    Status  Achieved        PT Long Term Goals - 07/18/17 1308      PT LONG TERM GOAL #1   Title  Patient will demonstrate proper lifting mechanics from floor to waist height with no increase in pain to improve overal function and performance of daily household activitties.     Time  6    Period  Weeks    Status  On-going      PT LONG TERM GOAL #2   Title  Patient will improve LEFS by 9 points to indidcate a clincally significant improvements in function and decrease in disability.     Time  6    Period  Weeks    Status  Revised      PT LONG TERM GOAL #3   Title  Patient will deny numbness and tingling distal to bil hips to demonstrate improved radicular symptoms and centrliaziaton of pain.    Time  6    Period  Weeks    Status  On-going      PT LONG TERM GOAL #4   Title  Patient will report maximum of 3/10 pain with activity to improve QOL and reduce pain with daily acitvites such as driving, lifting, cleaning, and mowing the lawn.     Time  6    Period  Weeks    Status  On-going        Plan - 07/25/17 1412    Clinical Impression Statement  Session limited today by  decreased time due to patient arriving late. Continued with ROM exercise and Rt LE strengthening w ith focus on quad. Educated patient on aquatic therapy programa nd availble appointment slot for 3:15 PM. Patient is agreeable and interested in participating in aquatic PT. He will continue to benefit from skilled PT interventions to address impairments and progress ROM and strength to improve mobilitiy.    Rehab Potential  Fair    PT Frequency  3x / week    PT Duration  3 weeks    PT Treatment/Interventions  ADLs/Self Care Home Management;Cryotherapy;Electrical Stimulation;Moist Heat;Traction;Gait training;Stair training;Functional mobility training;Therapeutic activities;Therapeutic exercise;Balance training;Neuromuscular re-education;Patient/family education;Manual techniques;Passive range of motion;Taping    PT Next Visit Plan  Focus on ROM exercises and joint mobilizations for flexion/extension. Continue with light scar massage and desensitization to anterior thigh and perform manual PRN for edema. Focus on Rt quad, hamstring, and gastroc strengthening. Initiate pool/aquatic therapy for Rt LE strengthening and ROM.    PT Home Exercise Plan  heel slides, quad sets, sensory re-integration. 06/21/17: SAQ; 07/18/17 - heel raises, hamstring stretch, quad stretch    Consulted and Agree with Plan of Care  Patient       Patient will benefit from skilled therapeutic intervention in order to improve the following deficits and impairments:  Pain, Postural dysfunction, Increased muscle spasms, Decreased mobility, Decreased activity tolerance, Decreased range of motion, Decreased strength, Impaired flexibility, Difficulty walking, Abnormal gait, Hypomobility, Decreased skin integrity, Decreased scar mobility, Increased fascial restricitons  Visit Diagnosis: Pain in right leg  Chronic bilateral low back pain with bilateral sciatica  Other abnormalities of gait and mobility  Abnormal posture     Problem  List Patient Active Problem List   Diagnosis Date Noted  . Rhabdomyolysis 05/25/2017  . Traumatic rhabdomyolysis (HCC) 05/25/2017  . Cellulitis and abscess of right leg 05/24/2017  . Alcohol abuse 05/24/2017  . Tobacco abuse 05/24/2017  . Hyponatremia 05/24/2017    Valentino Saxon, PT, DPT Physical Therapist with [facility]  07/25/2017 2:36 PM    [facility] [address] [place], [place], [address] Phone: [phone]   Fax:  [phone]  Name: BRENTON JOINES MRN: [id number] Date of Birth: 1970-04-18

## 2017-07-29 ENCOUNTER — Encounter (HOSPITAL_COMMUNITY): Payer: Self-pay

## 2017-07-29 ENCOUNTER — Other Ambulatory Visit: Payer: Self-pay

## 2017-07-29 ENCOUNTER — Ambulatory Visit (HOSPITAL_COMMUNITY): Payer: Medicaid Other | Attending: Family Medicine

## 2017-07-29 DIAGNOSIS — M79604 Pain in right leg: Secondary | ICD-10-CM | POA: Insufficient documentation

## 2017-07-29 NOTE — Therapy (Signed)
[facility] [address] [place], [place], [address] Phone: [phone]   Fax:  [phone]  Physical Therapy Treatment  Patient Details  Name: Chase Hurley MRN: [id number] Date of Birth: 25-Jul-1970 Referring Provider: Duwayne Heck, MD   Encounter Date: 07/29/2017  PT End of Session - 07/29/17 1756    Visit Number  6    Number of Visits  16    Date for PT Re-Evaluation  07/01/17    Authorization Type  Medicaid [place]    Authorization Time Period  Cert: 6/96-03/09/50; Medicaid prior authorization for 3 sessions: 05/23--> 5/29    Authorization - Visit Number  4    Authorization - Number of Visits  12    PT Start Time  1518    PT Stop Time  1557    PT Time Calculation (min)  39 min    Activity Tolerance  Patient tolerated treatment well    Behavior During Therapy  WFL for tasks assessed/performed       Past Medical History:  Diagnosis Date  . Dyspnea    with exertion  . Hepatitis    Hepatitis C  . History of kidney stones     Past Surgical History:  Procedure Laterality Date  . KNEE ARTHROSCOPY Right 07/03/2017   Procedure: Right knee arthroscopy with limited synovectomy and foreign body removal;  Surgeon: Yolonda Kida, MD;  Location: [facility] OR;  Service: Orthopedics;  Laterality: Right;  60 mins    There were no vitals filed for this visit.  Subjective Assessment - 07/29/17 1748    Subjective  Patient reports he had a lot of pain and stiffness over the weekend but states it has improved some today. He reports abiout a 6/10 for pain today. He reports he is trying to allwo his shorts to rest against his skin more often to get more comfortable with the sensation.    Pertinent History  history of chronic back pain     Limitations  Sitting;Lifting;Standing;Walking;House hold activities    How long can you sit comfortably?  5-10 minutes     How long can you stand comfortably?  unlimited with weight shifting    How long can you walk  comfortably?  estimated about a half mile     Diagnostic tests  X-Ray, MRI    Patient Stated Goals  have less pain    Currently in Pain?  Yes    Pain Score  6     Pain Location  Knee    Pain Orientation  Right    Pain Descriptors / Indicators  Aching;Sharp stiff    Pain Type  Chronic pain;Surgical pain    Pain Onset  More than a month ago    Pain Frequency  Constant    Aggravating Factors   walking, sitting still    Pain Relieving Factors  water, medicine    Effect of Pain on Daily Activities  some limitations, moderate       Adult Aquatic Therapy - 07/29/17 1752      Aquatic Therapy Subjective   Subjective  Patient reports he had a lot of pain and stiffness over the weekend but states it has improved some today. He reports abiout a 6/10 for pain today. He reports he is trying to allwo his shorts to rest against his skin more often to get more comfortable with the sensation.      Treatment   Exercises  Rt LE: HS curl 2x 25  reps with 10 lb ankle weight. LAQ with sustained hip flexion, 3" holds, 10 lb ankle weights. Knee flexion wihtout wieght 2x 10 reps. Hamstring stretch on step, 3x 30 seconds with AP overpressure along anterior thigh. Knee drive flexion stretch on step, 3x 30 seconds. Gastroc and Soleus stretch 2x 30 secdosn each against wall. 2x 25 reps single limb heel raise. 1x 25 reps forward lunge (Rt/Lt LE) and 1x 25 reps side lunge (Rt/Lt LE) in waist high water.     Specific Exercises  Hip/Low Back    Hip/Low Back  Rt hip extension/abduction: 1x 25 reps with 10 lb ankle weights        PT Education - 07/29/17 1756    Education provided  Yes    Education Details  Educated on posture for exercises throughout and on form with pool exercises.    Person(s) Educated  Patient    Methods  Explanation    Comprehension  Verbalized understanding       PT Short Term Goals - 07/18/17 1308      PT SHORT TERM GOAL #1   Title  Patient will be independent with HEP to reduce pain ,  improve posture, and activity tolerance to increase function.    Time  3    Period  Weeks    Status  Achieved      PT SHORT TERM GOAL #2   Title  After three weeks patien twill demonstrate improve Right knee ROM , 8-105 degrees.     Baseline  07/18/17: 9-92 degrees    Time  3    Period  Weeks    Status  On-going      PT SHORT TERM GOAL #3   Title  Pt will report improved tolerance of sensation of right leg skin near heeling wounds.     Time  3    Period  Weeks    Status  On-going      PT SHORT TERM GOAL #4   Title  Pt will report improved toleranfce to sitting from 10 to 20 minutes.     Time  3    Period  Weeks    Status  Achieved      PT SHORT TERM GOAL #5   Title  After 3 weeks patient will demonstrate AMB of >557ft at self selected gait speed > 1.48m/s.     Baseline  07/18/17 - 1.16 m/s    Time  3    Period  Weeks    Status  Achieved        PT Long Term Goals - 07/18/17 1308      PT LONG TERM GOAL #1   Title  Patient will demonstrate proper lifting mechanics from floor to waist height with no increase in pain to improve overal function and performance of daily household activitties.     Time  6    Period  Weeks    Status  On-going      PT LONG TERM GOAL #2   Title  Patient will improve LEFS by 9 points to indidcate a clincally significant improvements in function and decrease in disability.     Time  6    Period  Weeks    Status  Revised      PT LONG TERM GOAL #3   Title  Patient will deny numbness and tingling distal to bil hips to demonstrate improved radicular symptoms and centrliaziaton of pain.    Time  6  Period  Weeks    Status  On-going      PT LONG TERM GOAL #4   Title  Patient will report maximum of 3/10 pain with activity to improve QOL and reduce pain with daily acitvites such as driving, lifting, cleaning, and mowing the lawn.     Time  6    Period  Weeks    Status  On-going        Plan - 07/29/17 1757    Clinical Impression Statement   Initiated aquatic therapy today for ROM and strengthening exercises. Patient performed functional LE strengthening in environment between -30-50% body weight. He reported decreased stiffness at end of session and was able to perform greater repetitions compared to on land. He required minimal cueing for proper form with exercises and was encouraged to perform the pool exercises if he goes to a pool with his son this week. He will benefit from hydrostatic properties of water to address edema in joint as well. He will continue to benefit from skilled PT interventions to address impairments and progress towards goals both on land and in water.    Rehab Potential  Fair    PT Frequency  3x / week    PT Duration  3 weeks    PT Treatment/Interventions  ADLs/Self Care Home Management;Cryotherapy;Electrical Stimulation;Moist Heat;Traction;Gait training;Stair training;Functional mobility training;Therapeutic activities;Therapeutic exercise;Balance training;Neuromuscular re-education;Patient/family education;Manual techniques;Passive range of motion;Taping;Aquatic Therapy    PT Next Visit Plan  Continue focus with light scar massage and desensitization to anterior thigh and perform manual PRN for edema. Focus on Rt quad, hamstring, and gastroc strengthening. Continued aquatic therapy for Rt LE strengthening and ROM and consider deep water with belt for LE bicycle exercise for ROM.    PT Home Exercise Plan  heel slides, quad sets, sensory re-integration. 06/21/17: SAQ; 07/18/17 - heel raises, hamstring stretch, quad stretch    Consulted and Agree with Plan of Care  Patient       Patient will benefit from skilled therapeutic intervention in order to improve the following deficits and impairments:  Pain, Postural dysfunction, Increased muscle spasms, Decreased mobility, Decreased activity tolerance, Decreased range of motion, Decreased strength, Impaired flexibility, Difficulty walking, Abnormal gait, Hypomobility,  Decreased skin integrity, Decreased scar mobility, Increased fascial restricitons  Visit Diagnosis: Pain in right leg     Problem List Patient Active Problem List   Diagnosis Date Noted  . Rhabdomyolysis 05/25/2017  . Traumatic rhabdomyolysis (HCC) 05/25/2017  . Cellulitis and abscess of right leg 05/24/2017  . Alcohol abuse 05/24/2017  . Tobacco abuse 05/24/2017  . Hyponatremia 05/24/2017    [name], PT, DPT Physical Therapist with [facility]  07/29/2017 5:58 PM   [facility] [address] [place], [place], [address] Phone: [phone]   Fax:  [phone]  Name: Chase Hurley MRN: [id number] Date of Birth: 1970/04/15

## 2017-07-29 NOTE — Addendum Note (Signed)
Addended by: Glyn AdeQUINN, Shelva Hetzer on: 07/29/2017 06:18 PM   Modules accepted: Orders

## 2017-07-31 ENCOUNTER — Encounter (HOSPITAL_COMMUNITY): Payer: Self-pay

## 2017-07-31 ENCOUNTER — Other Ambulatory Visit: Payer: Self-pay

## 2017-07-31 ENCOUNTER — Ambulatory Visit (HOSPITAL_COMMUNITY): Payer: Medicaid Other

## 2017-07-31 DIAGNOSIS — M79604 Pain in right leg: Secondary | ICD-10-CM | POA: Diagnosis not present

## 2017-07-31 NOTE — Therapy (Signed)
[facility] [address] [place], [place], [address] Phone: [phone]   Fax:  [phone]  Physical Therapy Treatment  Patient Details  Name: Chase Hurley MRN: [id number] Date of Birth: 14-Jul-1970 Referring Provider: Duwayne HeckJason Rogers, MD   Encounter Date: 07/31/2017  PT End of Session - 07/31/17 1313    Visit Number  7    Number of Visits  16    Date for PT Re-Evaluation  07/01/17    Authorization Type  Medicaid [place]    Authorization Time Period  Cert: 6/96-2/9/525/20-07/02/17; Medicaid prior authorization for 3 sessions: 05/23--> 5/29    Authorization - Visit Number  5    Authorization - Number of Visits  12    PT Start Time  1308    PT Stop Time  1347    PT Time Calculation (min)  39 min    Activity Tolerance  Patient tolerated treatment well    Behavior During Therapy  WFL for tasks assessed/performed       Past Medical History:  Diagnosis Date  . Dyspnea    with exertion  . Hepatitis    Hepatitis C  . History of kidney stones     Past Surgical History:  Procedure Laterality Date  . KNEE ARTHROSCOPY Right 07/03/2017   Procedure: Right knee arthroscopy with limited synovectomy and foreign body removal;  Surgeon: Yolonda Kidaogers, Jason Patrick, MD;  [facility];  Service: Orthopedics;  Laterality: Right;  60 mins    There were no vitals filed for this visit.  Subjective Assessment - 07/31/17 1311    Subjective  Patient reports his knee felt really good after the aquatic therapy sessiona dn was not as stiff or sore throughout the day. He reports about 5/10 pain today.     Pertinent History  history of chronic back pain     Limitations  Sitting;Lifting;Standing;Walking;House hold activities    How long can you sit comfortably?  5-10 minutes     How long can you stand comfortably?  unlimited with weight shifting    How long can you walk comfortably?  estimated about a half mile     Diagnostic tests  X-Ray, MRI    Patient Stated Goals  have less  pain    Currently in Pain?  Yes    Pain Score  5     Pain Location  Knee    Pain Orientation  Right    Pain Descriptors / Indicators  Aching;Tightness    Pain Type  Chronic pain;Surgical pain    Pain Onset  More than a month ago    Pain Frequency  Constant    Aggravating Factors   walking, standing    Pain Relieving Factors  water, pain medicine       [facility] Adult PT Treatment/Exercise - 07/31/17 0001      Knee/Hip Exercises: Stretches   Active Hamstring Stretch  Right;3 reps;30 seconds;Limitations    Active Hamstring Stretch Limitations  12" box, with AP pressure on thigh     Knee: Self-Stretch to increase Flexion  Right;3 reps;30 seconds;Limitations    Knee: Self-Stretch Limitations  12" box, knee drives    Gastroc Stretch  3 reps;30 seconds;Limitations    Gastroc Stretch Limitations  slant board      Knee/Hip Exercises: Aerobic   Stationary Bike  5 minutes for AROM, seat 14, full revolutions, level 3 resistance      Knee/Hip Exercises: Machines for Strengthening   Cybex Knee Flexion  Rt LE; 2x 10 reps, 27 lbs    Other Mining engineer: single limb squat on Rt LE at 15 degrees, 2x 10 reps      Knee/Hip Exercises: Standing   Heel Raises  Both;2 sets;15 reps;3 seconds;Limitations on incline      Knee/Hip Exercises: Supine   Knee Extension  AROM    Knee Extension Limitations  3    Knee Flexion  AROM    Other Supine Knee/Hip Exercises  105    Other Supine Knee/Hip Exercises  Contract relax to increase extension: Rt LE: 3x 5 second contract, 30 secodn relax with overpressure from strap      Knee/Hip Exercises: Prone   Contract/Relax to Increase Flexion  Rt LE: 3x 5 second contract, 30 secodn relax with overpressure from strap        PT Education - 07/31/17 1342    Education provided  Yes    Education Details  Educated on new exercises throuhgout session. Educated on new stretches for HEP. educated to ice when he returns home to reduce swelling.    Person(s) Educated   Patient    Methods  Explanation;Verbal cues;Tactile cues    Comprehension  Verbalized understanding       PT Short Term Goals - 07/18/17 1308      PT SHORT TERM GOAL #1   Title  Patient will be independent with HEP to reduce pain , improve posture, and activity tolerance to increase function.    Time  3    Period  Weeks    Status  Achieved      PT SHORT TERM GOAL #2   Title  After three weeks patien twill demonstrate improve Right knee ROM , 8-105 degrees.     Baseline  07/18/17: 9-92 degrees    Time  3    Period  Weeks    Status  On-going      PT SHORT TERM GOAL #3   Title  Pt will report improved tolerance of sensation of right leg skin near heeling wounds.     Time  3    Period  Weeks    Status  On-going      PT SHORT TERM GOAL #4   Title  Pt will report improved toleranfce to sitting from 10 to 20 minutes.     Time  3    Period  Weeks    Status  Achieved      PT SHORT TERM GOAL #5   Title  After 3 weeks patient will demonstrate AMB of >518ft at self selected gait speed > 1.43m/s.     Baseline  07/18/17 - 1.16 m/s    Time  3    Period  Weeks    Status  Achieved        PT Long Term Goals - 07/18/17 1308      PT LONG TERM GOAL #1   Title  Patient will demonstrate proper lifting mechanics from floor to waist height with no increase in pain to improve overal function and performance of daily household activitties.     Time  6    Period  Weeks    Status  On-going      PT LONG TERM GOAL #2   Title  Patient will improve LEFS by 9 points to indidcate a clincally significant improvements in function and decrease in disability.     Time  6    Period  Weeks    Status  Revised  PT LONG TERM GOAL #3   Title  Patient will deny numbness and tingling distal to bil hips to demonstrate improved radicular symptoms and centrliaziaton of pain.    Time  6    Period  Weeks    Status  On-going      PT LONG TERM GOAL #4   Title  Patient will report maximum of 3/10 pain with  activity to improve QOL and reduce pain with daily acitvites such as driving, lifting, cleaning, and mowing the lawn.     Time  6    Period  Weeks    Status  On-going         Plan - 07/31/17 1312    Clinical Impression Statement  Continued to focus on ROM exercises and Rt LE strengthening. Initiated machine strengthening for Rt LE quad, hamstring, and glutes. He continues to be limited by anterior thigh sensitivity on Rt LE and will need further interventions for desensitization. He was educated on using terry cloth or jeans around thigh to rub skin and reduce sensitivity to touch. HEP updated with contract relax stretch to further progress ROM. He will benefit from ongoing skilled PT interventions to address current impairments and progress towards remaining goals.    Rehab Potential  Fair    PT Frequency  3x / week    PT Duration  3 weeks    PT Treatment/Interventions  ADLs/Self Care Home Management;Cryotherapy;Electrical Stimulation;Moist Heat;Traction;Gait training;Stair training;Functional mobility training;Therapeutic activities;Therapeutic exercise;Balance training;Neuromuscular re-education;Patient/family education;Manual techniques;Passive range of motion;Taping;Aquatic Therapy    PT Next Visit Plan  Perform manual PRN for edema. Focus on Rt quad, hamstring, and gastroc strengthening. Continued aquatic therapy for Rt LE strengthening and ROM and consider deep water with belt for LE bicycle exercise for ROM. Follow up with contract relax stretch.    PT Home Exercise Plan  heel slides, quad sets, sensory re-integration. 06/21/17: SAQ; 07/18/17 - heel raises, hamstring stretch, quad stretch; 07/31/17 -     Consulted and Agree with Plan of Care  Patient       Patient will benefit from skilled therapeutic intervention in order to improve the following deficits and impairments:  Pain, Postural dysfunction, Increased muscle spasms, Decreased mobility, Decreased activity tolerance, Decreased range  of motion, Decreased strength, Impaired flexibility, Difficulty walking, Abnormal gait, Hypomobility, Decreased skin integrity, Decreased scar mobility, Increased fascial restricitons  Visit Diagnosis: Pain in right leg     Problem List Patient Active Problem List   Diagnosis Date Noted  . Rhabdomyolysis 05/25/2017  . Traumatic rhabdomyolysis (HCC) 05/25/2017  . Cellulitis and abscess of right leg 05/24/2017  . Alcohol abuse 05/24/2017  . Tobacco abuse 05/24/2017  . Hyponatremia 05/24/2017    Valentino Saxon, PT, DPT Physical Therapist with [facility]  07/31/2017 1:49 PM    [facility] [address] [place], [place], [address] Phone: [phone]   Fax:  [phone]  Name: Chase Hurley MRN: [id number] Date of Birth: [birth year]

## 2017-08-05 ENCOUNTER — Telehealth (HOSPITAL_COMMUNITY): Payer: Self-pay

## 2017-08-05 ENCOUNTER — Ambulatory Visit (HOSPITAL_COMMUNITY): Payer: Medicaid Other

## 2017-08-05 NOTE — Telephone Encounter (Signed)
Called patient on this date after no-show for PT treatment session. Pt reports that he forgot, and has had a hective week with multiple medical appointments. He is informed of his next appointment to which he replies he will be unable to make and needs to set up a visit for next week. I told patient that we would contact him later to schedule him.   2:14 PM, 08/05/17 Rosamaria LintsAllan C Hurley, PT, DPT Physical Therapist at [facility] [phone] (office)

## 2017-08-08 ENCOUNTER — Encounter (HOSPITAL_COMMUNITY): Payer: Medicaid Other

## 2017-10-03 ENCOUNTER — Encounter (HOSPITAL_COMMUNITY): Payer: Self-pay

## 2017-10-03 NOTE — Therapy (Signed)
[place], [place], [address] Phone: [phone]   Fax:  [phone]  Patient Details  Name: Chase Hurley MRN: [id number] Date of Birth: 03-08-1970 Referring Provider:  No ref. provider found  Encounter Date: 10/03/2017   PHYSICAL THERAPY DISCHARGE SUMMARY  Visits from Start of Care: 7  Current functional level related to goals / functional outcomes: Patient has not returned since last visit on 07/31/17 and not rescheduling after no-showing one appointment and cancelling another.    Remaining deficits:  Goal status at time of last visit:  PT Short Term Goals - 07/18/17 1308            PT SHORT TERM GOAL #1   Title  Patient will be independent with HEP to reduce pain , improve posture, and activity tolerance to increase function.    Time  3    Period  Weeks    Status  Achieved        PT SHORT TERM GOAL #2   Title  After three weeks patien twill demonstrate improve Right knee ROM , 8-105 degrees.     Baseline  07/18/17: 9-92 degrees    Time  3    Period  Weeks    Status  On-going        PT SHORT TERM GOAL #3   Title  Pt will report improved tolerance of sensation of right leg skin near heeling wounds.     Time  3    Period  Weeks    Status  On-going        PT SHORT TERM GOAL #4   Title  Pt will report improved toleranfce to sitting from 10 to 20 minutes.     Time  3    Period  Weeks    Status  Achieved        PT SHORT TERM GOAL #5   Title  After 3 weeks patient will demonstrate AMB of >55f at self selected gait speed > 1.074m.     Baseline  07/18/17 - 1.16 m/s    Time  3    Period  Weeks    Status  Achieved           PT Long Term Goals - 07/18/17 1308            PT LONG TERM GOAL #1   Title  Patient will demonstrate proper lifting mechanics from floor to waist height with no increase in pain to improve overal function and performance of daily household  activitties.     Time  6    Period  Weeks    Status  On-going        PT LONG TERM GOAL #2   Title  Patient will improve LEFS by 9 points to indidcate a clincally significant improvements in function and decrease in disability.     Time  6    Period  Weeks    Status  Revised        PT LONG TERM GOAL #3   Title  Patient will deny numbness and tingling distal to bil hips to demonstrate improved radicular symptoms and centrliaziaton of pain.    Time  6    Period  Weeks    Status  On-going        PT LONG TERM GOAL #4   Title  Patient will report maximum of 3/10 pain with activity to improve QOL and reduce pain with daily  acitvites such as driving, lifting, cleaning, and mowing the lawn.     Time  6    Period  Weeks    Status  Water quality scientist / Equipment: Educated on [employer] throughout and on attendance policy for therapy.   Plan: Patient agrees to discharge.  Patient goals were partially met. Patient is being discharged due to not returning since the last visit.  ?????     Kipp Brood, PT, DPT Physical Therapist with [facility]  10/03/2017 12:56 PM    [place], [place], [address] Phone: [phone]   Fax:  [phone]

## 2017-12-10 ENCOUNTER — Telehealth (HOSPITAL_COMMUNITY): Payer: Self-pay

## 2017-12-10 NOTE — Telephone Encounter (Signed)
Chase Hurley called about adding back pain to referral - advised Chase Hurley to call Dr. Romeo AppleHarrison Office to request add on and to let us know what is decided. Pt has Medicaid and they will only pay for 1 eval a yr. NF 12/10/17

## 2017-12-13 ENCOUNTER — Other Ambulatory Visit: Payer: Self-pay | Admitting: Orthopedic Surgery

## 2017-12-13 DIAGNOSIS — S8981XA Other specified injuries of right lower leg, initial encounter: Secondary | ICD-10-CM

## 2017-12-16 ENCOUNTER — Other Ambulatory Visit (HOSPITAL_COMMUNITY): Payer: Self-pay | Admitting: Orthopedic Surgery

## 2017-12-16 DIAGNOSIS — S8981XA Other specified injuries of right lower leg, initial encounter: Secondary | ICD-10-CM

## 2017-12-17 ENCOUNTER — Encounter (HOSPITAL_COMMUNITY): Payer: Self-pay

## 2017-12-17 ENCOUNTER — Other Ambulatory Visit: Payer: Self-pay

## 2017-12-17 ENCOUNTER — Ambulatory Visit (HOSPITAL_COMMUNITY): Payer: Medicaid Other | Attending: Orthopedic Surgery

## 2017-12-17 DIAGNOSIS — M6281 Muscle weakness (generalized): Secondary | ICD-10-CM | POA: Diagnosis present

## 2017-12-17 DIAGNOSIS — M79604 Pain in right leg: Secondary | ICD-10-CM | POA: Insufficient documentation

## 2017-12-17 DIAGNOSIS — M25661 Stiffness of right knee, not elsewhere classified: Secondary | ICD-10-CM | POA: Insufficient documentation

## 2017-12-17 DIAGNOSIS — R2689 Other abnormalities of gait and mobility: Secondary | ICD-10-CM | POA: Insufficient documentation

## 2017-12-17 NOTE — Patient Instructions (Signed)
Straight Leg Raise    Tighten stomach and slowly raise locked right leg _9___ inches from floor. Repeat _10-15___ times per set. Do __1__ sets per session. Do ____ sessions per day.  http://orth.exer.us/1102   Copyright  VHI. All rights reserved.   HIP: Abduction - Side-Lying    Lie on side, legs straight and in line with trunk. Squeeze glutes. Raise top leg up and slightly back. Point toes forward. _10-15__ reps per set, _1__ sets per day. Bend bottom leg to stabilize pelvis.  Copyright  VHI. All rights reserved.    Hip Extension: 2-4 Inches    Tighten gluteal muscle. Lift one leg 10-15__ times. Restabilize pelvis. Repeat with other leg. Keep pelvis still. Be sure pelvis does not rotate and back does not arch. Do 1___ sets, _1__ times per day.  http://ss.exer.us/62   Copyright  VHI. All rights reserved.    HIP: Adduction - Side-Lying    Lie on side with top leg crossed in front of bottom leg. Raise bottom leg, keep knee straight. 10-15___ reps per set, _1__ sets per day.   Copyright  VHI. All rights reserved.
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
## 2017-12-20 ENCOUNTER — Ambulatory Visit (HOSPITAL_COMMUNITY): Payer: Medicaid Other

## 2017-12-23 ENCOUNTER — Encounter (HOSPITAL_COMMUNITY): Payer: Self-pay

## 2017-12-23 ENCOUNTER — Ambulatory Visit (HOSPITAL_COMMUNITY)
Admission: RE | Admit: 2017-12-23 | Discharge: 2017-12-23 | Disposition: A | Discharge status: Home / Self Care | Payer: Medicaid Other | Source: Ambulatory Visit | Attending: Orthopedic Surgery | Admitting: Orthopedic Surgery

## 2017-12-23 ENCOUNTER — Ambulatory Visit (HOSPITAL_COMMUNITY): Payer: Medicaid Other

## 2017-12-23 DIAGNOSIS — M25661 Stiffness of right knee, not elsewhere classified: Secondary | ICD-10-CM

## 2017-12-23 DIAGNOSIS — R2689 Other abnormalities of gait and mobility: Secondary | ICD-10-CM

## 2017-12-23 DIAGNOSIS — S8981XA Other specified injuries of right lower leg, initial encounter: Secondary | ICD-10-CM | POA: Insufficient documentation

## 2017-12-23 DIAGNOSIS — M79604 Pain in right leg: Secondary | ICD-10-CM

## 2017-12-23 DIAGNOSIS — M6281 Muscle weakness (generalized): Secondary | ICD-10-CM

## 2017-12-23 NOTE — Patient Instructions (Addendum)
Prone Heel-to-Buttocks Stretch    Lying on stomach, bring heel of one foot toward buttocks while exhaling. Strap around right ankle. Hold 30 seconds. Repeat __2__ times. Do _1___ sessions per day.  http://gt2.exer.us/267   Copyright  VHI. All rights reserved.  Hamstring Stretch: Active    Support behind right knee. Starting with knee bent, attempt to straighten knee until a comfortable stretch is felt in back of thigh. Hold 30____ seconds. Repeat _2___ times per set. Do __1__ sets per session. Do 1____ sessions per day.  http://orth.exer.us/158   Copyright  VHI. All rights reserved.   Knee Flexion: Hamstring Drop (Eccentric) - Standing Over Table    Lean over counter. Bend one knee quickly, stopping at 90. Slowly lower foot, extending knee, for 3-5 seconds. 10-15___ reps per set, 1___ sets per day. Add _1-2__ lbs when you achieve _20__ repetitions.  http://ecce.exer.us/104   Copyright  VHI. All rights reserved.   Try single leg stance on throw pillow at kitchen sink for 30 second bouts.
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
## 2018-01-01 ENCOUNTER — Ambulatory Visit (HOSPITAL_COMMUNITY): Payer: Medicaid Other

## 2018-01-02 ENCOUNTER — Encounter (HOSPITAL_COMMUNITY): Payer: Self-pay | Admitting: Physical Therapy

## 2018-01-02 ENCOUNTER — Ambulatory Visit (HOSPITAL_COMMUNITY): Payer: Medicaid Other | Attending: Orthopedic Surgery | Admitting: Physical Therapy

## 2018-01-02 DIAGNOSIS — M79604 Pain in right leg: Secondary | ICD-10-CM | POA: Insufficient documentation

## 2018-01-02 DIAGNOSIS — R2689 Other abnormalities of gait and mobility: Secondary | ICD-10-CM

## 2018-01-02 DIAGNOSIS — M25661 Stiffness of right knee, not elsewhere classified: Secondary | ICD-10-CM | POA: Insufficient documentation

## 2018-01-02 DIAGNOSIS — M6281 Muscle weakness (generalized): Secondary | ICD-10-CM
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
## 2018-01-02 NOTE — Patient Instructions (Addendum)
Heel Raise: Bilateral (Standing)    Rise on balls of feet. Repeat _10___ times per set. Do ___1_ sets per session. Do __2__ sessions per day.  http://orth.exer.us/38   Copyright  VHI. All rights reserved.  Balance: Three-Way Leg Swing    Stand on right foot, hands on hips. Reach other foot forward _10 seconds ___ sideways __10 seconds _, back __10 __ . Relax. Repeat __3__ times per set. Do _1___ sets per session. Do __2__ sessions per day.  http://orth.exer.us/86   Copyright  VHI. All rights reserved.  Functional Quadriceps: Chair Squat    Keeping feet flat on floor, shoulder width apart, squat as low as is comfortable. Use support as necessary. Repeat _10___ times per set. Do 1____ sets per session. Do _2___ sessions per day.  http://orth.exer.us/736   Copyright  VHI. All rights reserved.  Step-Down / Step-Up    Stand on stair step or _6___ inch stool. Slowly bend right leg, lowering other foot to floor. Return by straightening front leg. Repeat _10___ times per set. Do __1__ sets per session. Do __2__ sessions per day.  http://orth.exer.us/684   Copyright  VHI. All rights reserved.  Hip Extension (All-Fours)    Lift right leg back with knee slightly flexed. Do not arch neck or back. Repeat __10__ times per set. Do _1___ sets per session. Do ___2_ sessions per day.  http://orth.exer.us/106   Copyright  VHI. All rights reserved.

## 2018-01-03 ENCOUNTER — Ambulatory Visit (HOSPITAL_COMMUNITY): Payer: Medicaid Other

## 2018-01-06 ENCOUNTER — Ambulatory Visit (HOSPITAL_COMMUNITY): Payer: Medicaid Other

## 2018-01-09 ENCOUNTER — Ambulatory Visit (HOSPITAL_COMMUNITY): Payer: Medicaid Other

## 2018-01-09 ENCOUNTER — Telehealth (HOSPITAL_COMMUNITY): Payer: Self-pay

## 2018-01-09 NOTE — Telephone Encounter (Signed)
No show, called and spoke to pt who stated he was unaware of apt today.  Stated he is unable to make it at 315 due to need to pick up his child.  Reminded next apt date and time.  [address]Casey Cockerham, LPTA; CBIS [phone]

## 2018-01-10 ENCOUNTER — Ambulatory Visit (HOSPITAL_COMMUNITY): Payer: Medicaid Other | Admitting: Physical Therapy

## 2018-01-10 DIAGNOSIS — R2689 Other abnormalities of gait and mobility: Secondary | ICD-10-CM

## 2018-01-10 DIAGNOSIS — M79604 Pain in right leg: Secondary | ICD-10-CM

## 2018-01-10 DIAGNOSIS — M6281 Muscle weakness (generalized): Secondary | ICD-10-CM

## 2018-01-10 DIAGNOSIS — M25661 Stiffness of right knee, not elsewhere classified: Secondary | ICD-10-CM
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
## 2018-01-14 ENCOUNTER — Telehealth (HOSPITAL_COMMUNITY): Payer: Self-pay | Admitting: Family Medicine

## 2018-01-14 ENCOUNTER — Ambulatory Visit (HOSPITAL_COMMUNITY): Payer: Medicaid Other

## 2018-01-14 NOTE — Telephone Encounter (Signed)
01/14/18  pt left a message that he can't make it today

## 2018-01-16 ENCOUNTER — Ambulatory Visit (HOSPITAL_COMMUNITY): Payer: Medicaid Other

## 2018-01-23 ENCOUNTER — Ambulatory Visit (HOSPITAL_COMMUNITY): Payer: Medicaid Other | Admitting: Physical Therapy

## 2018-01-23 ENCOUNTER — Telehealth (HOSPITAL_COMMUNITY): Payer: Self-pay | Admitting: Physical Therapy

## 2018-01-23 NOTE — Telephone Encounter (Signed)
Patient had to cancel his appt for today he have another appt around the same time.

## 2018-01-23 NOTE — Telephone Encounter (Signed)
Called pt RE missed appointment.  [place] that [place].  PT requested to attempt to extend insurance.  Therapist will do so.

## 2018-01-24 ENCOUNTER — Telehealth (HOSPITAL_COMMUNITY): Payer: Self-pay | Admitting: Physical Therapy

## 2018-01-24 NOTE — Telephone Encounter (Signed)
L/m need to schedule more visit Medicaid approved 3 more. Would like to get pt in 1x on Thrus or Friday (1-2or3rd) and 2x the next week.

## 2018-01-31 ENCOUNTER — Ambulatory Visit (HOSPITAL_COMMUNITY): Payer: Medicaid Other | Attending: Orthopedic Surgery | Admitting: Physical Therapy

## 2018-01-31 ENCOUNTER — Encounter

## 2018-01-31 ENCOUNTER — Encounter (HOSPITAL_COMMUNITY): Payer: Self-pay | Admitting: Physical Therapy

## 2018-01-31 DIAGNOSIS — R2689 Other abnormalities of gait and mobility: Secondary | ICD-10-CM

## 2018-01-31 DIAGNOSIS — M79604 Pain in right leg: Secondary | ICD-10-CM | POA: Diagnosis present

## 2018-01-31 DIAGNOSIS — M25661 Stiffness of right knee, not elsewhere classified: Secondary | ICD-10-CM | POA: Diagnosis present

## 2018-01-31 DIAGNOSIS — M6281 Muscle weakness (generalized): Secondary | ICD-10-CM
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
## 2018-02-04 ENCOUNTER — Ambulatory Visit (HOSPITAL_COMMUNITY): Payer: Medicaid Other

## 2018-02-04 ENCOUNTER — Telehealth (HOSPITAL_COMMUNITY): Payer: Self-pay

## 2018-02-04 NOTE — Telephone Encounter (Signed)
Telephone call: 02/04/2018  PT spoke w/ pt regarding his appt at 9:45 am this morning. Pt reported he is ill. PT informed patient his insurance approved 3 more visits 01/02-15/2020; his next appt is 02/06/18, Th at 9:45 am to please call if he is still ill and unable to make it.  Maryrose Colvin Hartnett-Rands, PT

## 2018-02-06 ENCOUNTER — Ambulatory Visit (HOSPITAL_COMMUNITY): Payer: Medicaid Other

## 2018-02-06 ENCOUNTER — Telehealth (HOSPITAL_COMMUNITY): Payer: Self-pay

## 2018-02-06 NOTE — Telephone Encounter (Signed)
Called pt explained that we understand he has been sick but if he keeps No Showing and not calling that Medicaid may not extend his visits for treatment. Requested he call back to get in for Monday and Wed next week 1/13 and 15th-his approval ends 1/15.

## 2018-02-07 ENCOUNTER — Encounter

## 2018-04-03 ENCOUNTER — Encounter (HOSPITAL_COMMUNITY): Payer: Self-pay | Admitting: Physical Therapy

## 2018-04-03 NOTE — Therapy (Unsigned)
[place], [place], [address] Phone: [phone]   Fax:  [phone]  Patient Details  Name: Chase Hurley MRN: [id number] Date of Birth: [birth year] Referring Provider: Angelia Mould   Encounter Date: 04/03/2018    PHYSICAL THERAPY DISCHARGE SUMMARY  Visits from Start of Care: 5  Current functional level related to goals / functional outcomes: PT last treatment stated that he was no better.   Remaining deficits: Continued pain in pt knee   Education / Equipment: HEP Plan: Patient agrees to discharge.  Patient goals were not met. Patient is being discharged due to not returning since the last visit.  ?????   Rayetta Humphrey, PT CLT [phone]  Rayetta Humphrey, PT CLT [phone] 04/03/2018, 8:55 AM  [place], [place], [address] Phone: [phone]   Fax:  [phone]

## 2019-08-23 IMAGING — US US ABDOMEN LIMITED
1 series · 14 of 25 positions shown · non-contrast
Comparison: CT abdomen and pelvis 04/27/2016

CLINICAL DATA: Hepatitis-C infection without hepatic coma

EXAM:
ULTRASOUND ABDOMEN LIMITED RIGHT UPPER QUADRANT

[Series 1: us abdomen limited · 0.19mm/px · 14 of 62 slices shown]
[im 1/62]
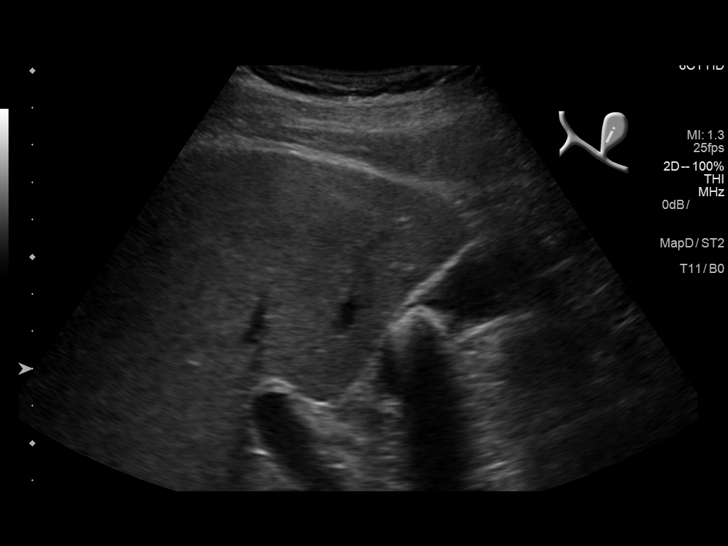
[im 6/62]
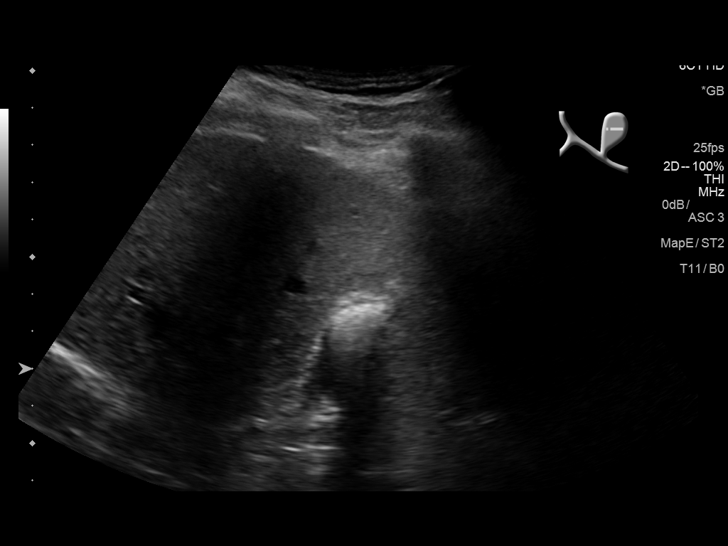
[im 11/62]
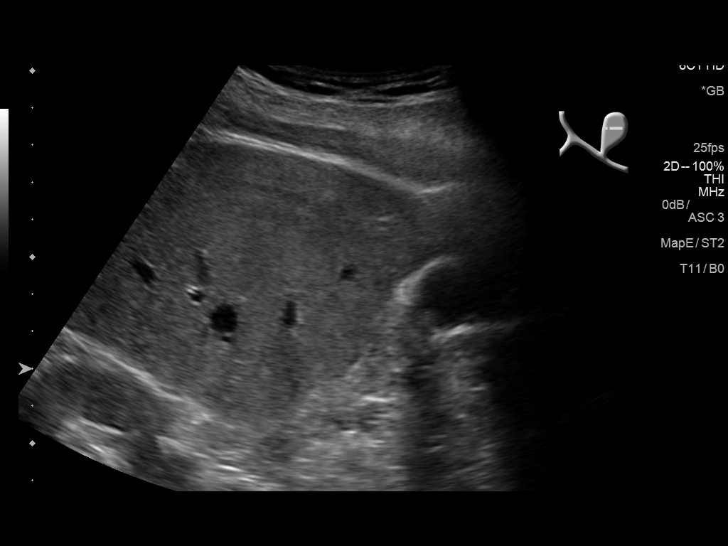
[im 16/62]
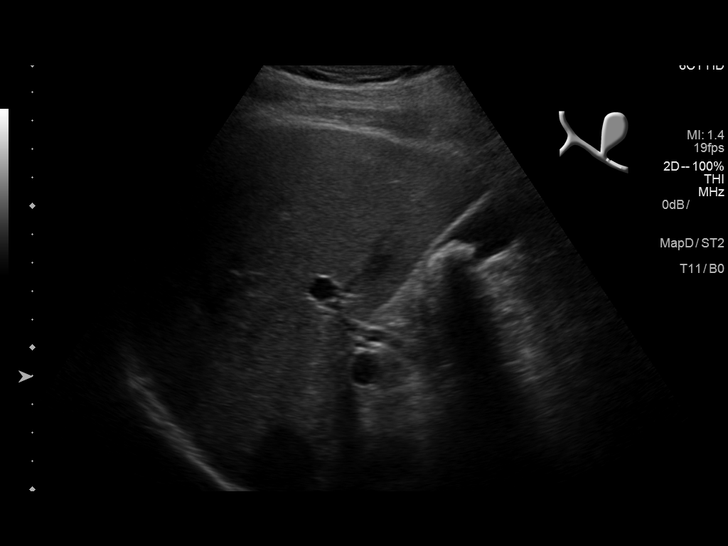
[im 21/62]
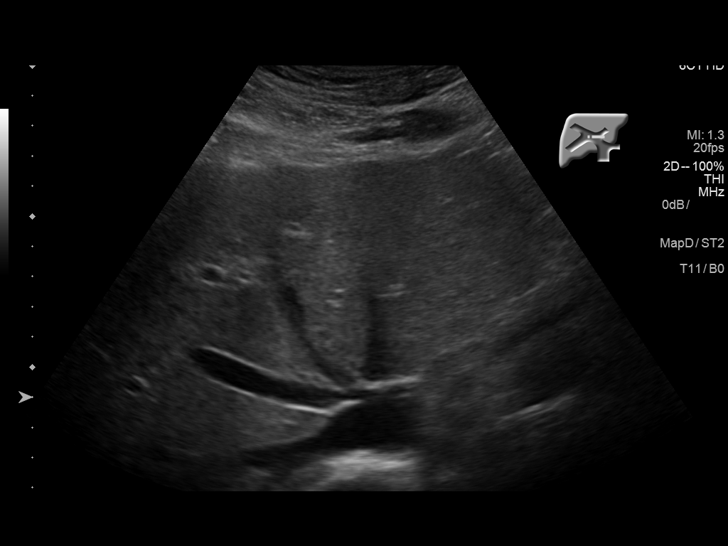
[im 23/62]
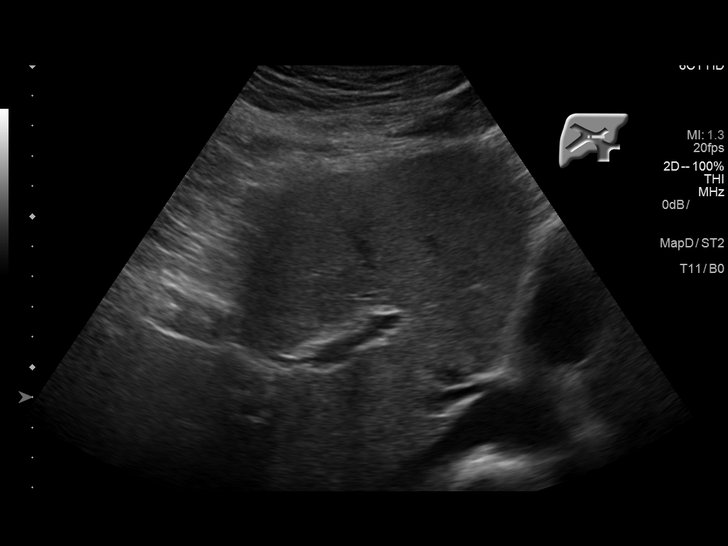
[im 28/62]
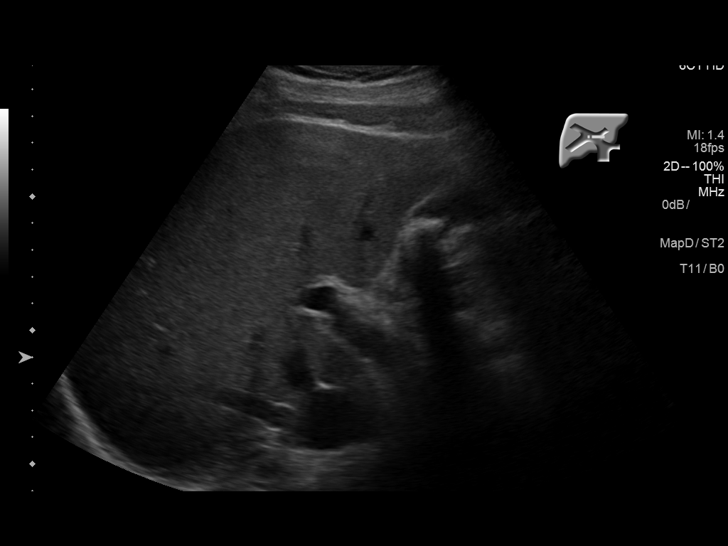
[im 34/62]
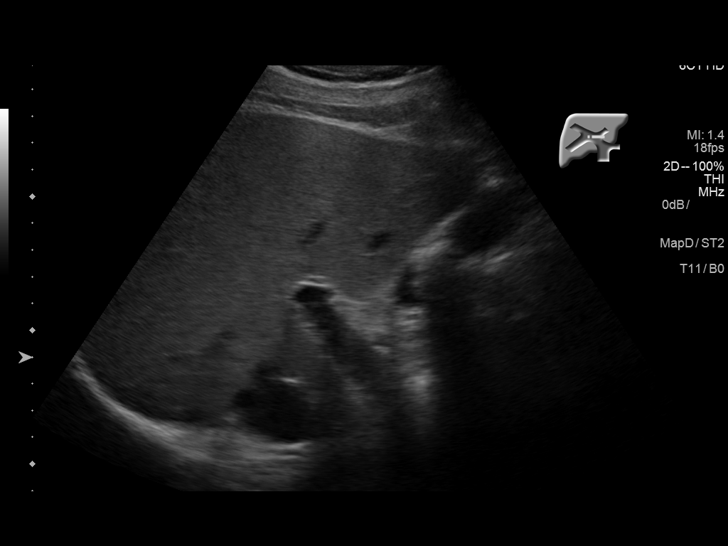
[im 39/62]
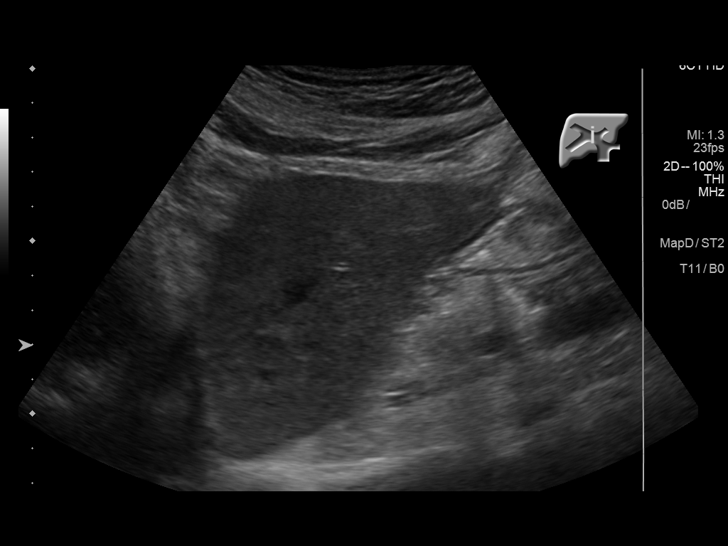
[im 41/62]
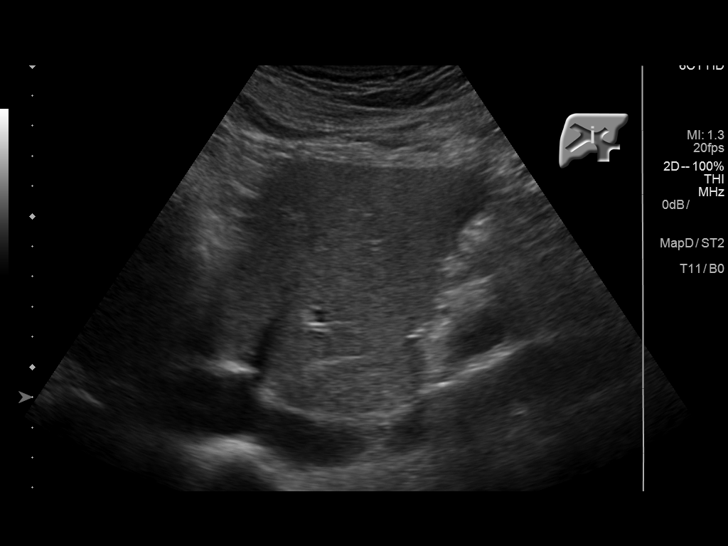
[im 46/62]
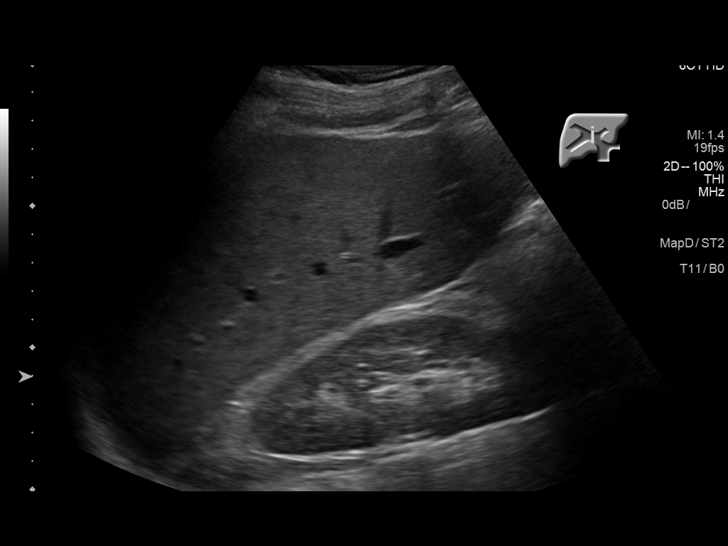
[im 51/62]
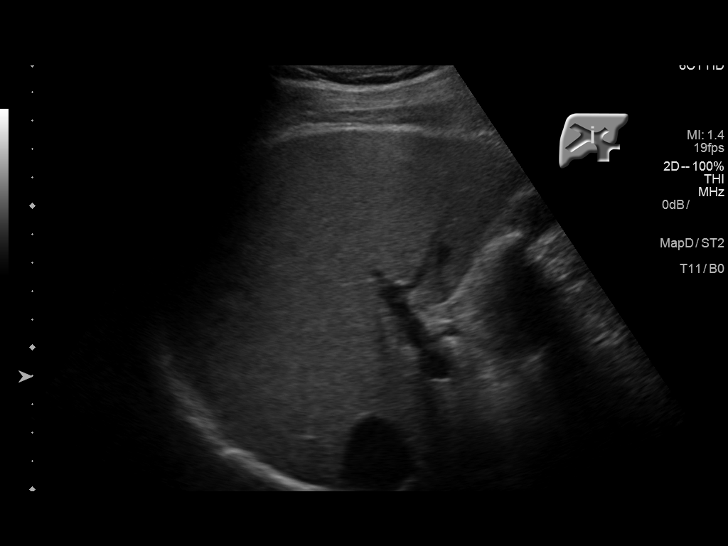
[im 56/62]
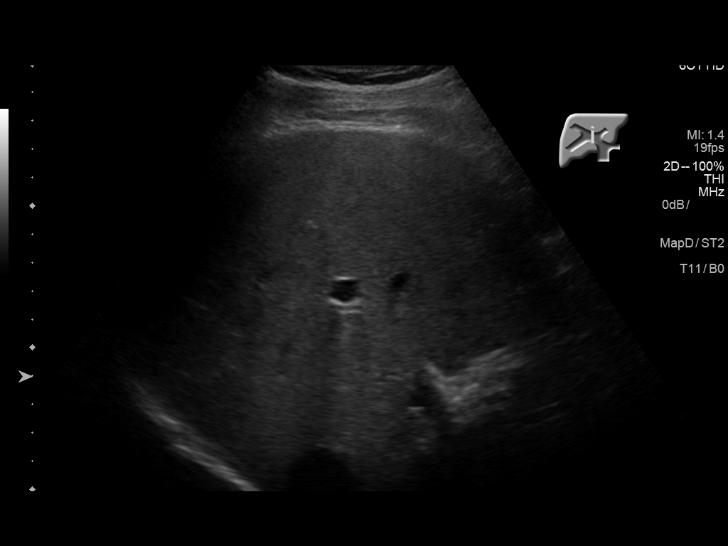
[im 62/62]
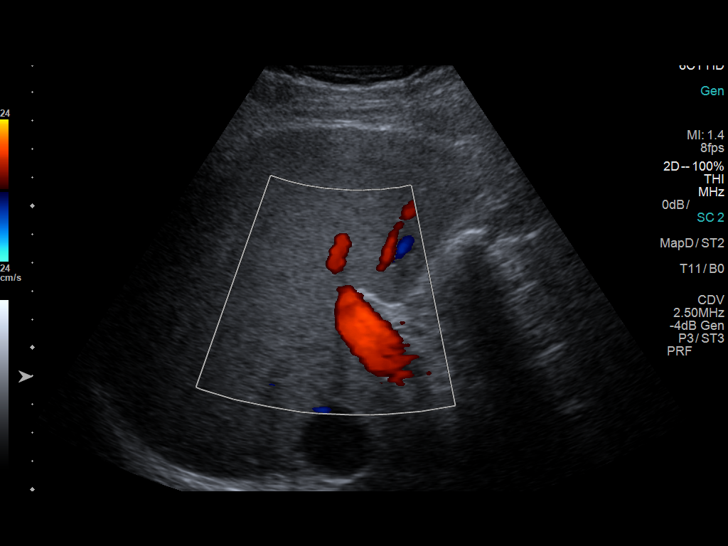

[14 of 25 positions shown; findings below may reference images not displayed]

FINDINGS: Gallbladder:

Shadowing calculus in gallbladder 17 mm diameter. Upper normal
gallbladder wall thickness. No pericholecystic fluid or sonographic
Murphy sign.

Common bile duct:

Diameter: 4 mm diameter, normal

Liver:

Normal parenchymal echogenicity. No mass or nodularity. Portal vein
is patent on color Doppler imaging with normal direction of blood
flow towards the liver.

No RIGHT upper quadrant free fluid
IMPRESSION: Cholelithiasis.

No acute abnormalities.

## 2020-01-28 NOTE — Telephone Encounter (Signed)
Error

## 2020-03-24 ENCOUNTER — Emergency Department (HOSPITAL_COMMUNITY)
Admission: EM | Admit: 2020-03-24 | Discharge: 2020-03-24 | Disposition: A | Discharge status: Home / Self Care | Payer: Medicaid Other | Attending: Emergency Medicine | Admitting: Emergency Medicine

## 2020-03-24 ENCOUNTER — Encounter (HOSPITAL_COMMUNITY): Payer: Self-pay | Admitting: Emergency Medicine

## 2020-03-24 ENCOUNTER — Other Ambulatory Visit: Payer: Self-pay

## 2020-03-24 ENCOUNTER — Emergency Department (HOSPITAL_COMMUNITY): Payer: Medicaid Other

## 2020-03-24 DIAGNOSIS — F17210 Nicotine dependence, cigarettes, uncomplicated: Secondary | ICD-10-CM | POA: Insufficient documentation

## 2020-03-24 DIAGNOSIS — B349 Viral infection, unspecified: Secondary | ICD-10-CM | POA: Diagnosis not present

## 2020-03-24 DIAGNOSIS — Z20822 Contact with and (suspected) exposure to covid-19: Secondary | ICD-10-CM | POA: Diagnosis not present

## 2020-03-24 DIAGNOSIS — R112 Nausea with vomiting, unspecified: Secondary | ICD-10-CM

## 2020-03-24 DIAGNOSIS — R197 Diarrhea, unspecified: Secondary | ICD-10-CM

## 2020-03-24 DIAGNOSIS — R062 Wheezing: Secondary | ICD-10-CM

## 2020-03-24 LAB — RESP PANEL BY RT-PCR (FLU A&B, COVID) ARPGX2
Influenza A by PCR: NEGATIVE
Influenza B by PCR: NEGATIVE
SARS Coronavirus 2 by RT PCR: NEGATIVE

## 2020-03-24 LAB — CBC WITH DIFFERENTIAL/PLATELET
Abs Immature Granulocytes: 0.03 10*3/uL (ref 0.00–0.07)
Basophils Absolute: 0.1 10*3/uL (ref 0.0–0.1)
Basophils Relative: 1 %
Eosinophils Absolute: 0.2 10*3/uL (ref 0.0–0.5)
Eosinophils Relative: 2 %
HCT: 52.6 % — ABNORMAL HIGH (ref 39.0–52.0)
Hemoglobin: 18.4 g/dL — ABNORMAL HIGH (ref 13.0–17.0)
Immature Granulocytes: 0 %
Lymphocytes Relative: 11 %
Lymphs Abs: 1.2 10*3/uL (ref 0.7–4.0)
MCH: 32 pg (ref 26.0–34.0)
MCHC: 35 g/dL (ref 30.0–36.0)
MCV: 91.5 fL (ref 80.0–100.0)
Monocytes Absolute: 0.8 10*3/uL (ref 0.1–1.0)
Monocytes Relative: 7 %
Neutro Abs: 8.4 10*3/uL — ABNORMAL HIGH (ref 1.7–7.7)
Neutrophils Relative %: 79 %
Platelets: 221 10*3/uL (ref 150–400)
RBC: 5.75 MIL/uL (ref 4.22–5.81)
RDW: 12.6 % (ref 11.5–15.5)
WBC: 10.7 10*3/uL — ABNORMAL HIGH (ref 4.0–10.5)
nRBC: 0 % (ref 0.0–0.2)

## 2020-03-24 LAB — COMPREHENSIVE METABOLIC PANEL
ALT: 27 U/L (ref 0–44)
AST: 20 U/L (ref 15–41)
Albumin: 4.4 g/dL (ref 3.5–5.0)
Alkaline Phosphatase: 73 U/L (ref 38–126)
Anion gap: 8 (ref 5–15)
BUN: 13 mg/dL (ref 6–20)
CO2: 19 mmol/L — ABNORMAL LOW (ref 22–32)
Calcium: 9.1 mg/dL (ref 8.9–10.3)
Chloride: 107 mmol/L (ref 98–111)
Creatinine, Ser: 0.85 mg/dL (ref 0.61–1.24)
GFR, Estimated: >60 mL/min (ref >60)
Glucose, Bld: 120 mg/dL — ABNORMAL HIGH (ref 70–99)
Potassium: 4.3 mmol/L (ref 3.5–5.1)
Sodium: 134 mmol/L — ABNORMAL LOW (ref 135–145)
Total Bilirubin: 0.7 mg/dL (ref 0.3–1.2)
Total Protein: 8.4 g/dL — ABNORMAL HIGH (ref 6.5–8.1)

## 2020-03-24 LAB — URINALYSIS, ROUTINE W REFLEX MICROSCOPIC
Bilirubin Urine: NEGATIVE
Glucose, UA: NEGATIVE mg/dL
Hgb urine dipstick: NEGATIVE
Ketones, ur: NEGATIVE mg/dL
Leukocytes,Ua: NEGATIVE
Nitrite: NEGATIVE
Protein, ur: NEGATIVE mg/dL
Specific Gravity, Urine: 1.021 (ref 1.005–1.030)
pH: 5 (ref 5.0–8.0)

## 2020-03-24 LAB — LIPASE, BLOOD: Lipase: 26 U/L (ref 11–51)

## 2020-03-24 MED ORDER — ALUM & MAG HYDROXIDE-SIMETH 400-400-40 MG/5ML PO SUSP
5.0000 mL | Freq: Four times a day (QID) | ORAL | 0 refills | Status: AC | PRN
Start: 2020-03-24 — End: ?

## 2020-03-24 MED ORDER — DICYCLOMINE HCL 20 MG PO TABS: 20.0000 mg | ORAL_TABLET | Freq: Two times a day (BID) | ORAL | 0 refills | Status: AC | PRN

## 2020-03-24 MED ORDER — SODIUM CHLORIDE 0.9 % IV BOLUS
1000.0000 mL | Freq: Once | INTRAVENOUS | Status: AC
  Administered 2020-03-24: 1000 mL via INTRAVENOUS

## 2020-03-24 MED ORDER — ALUM & MAG HYDROXIDE-SIMETH 200-200-20 MG/5ML PO SUSP
30.0000 mL | Freq: Once | ORAL | Status: AC
  Administered 2020-03-24: 30 mL via ORAL
  Filled 2020-03-24: qty 30

## 2020-03-24 MED ORDER — ONDANSETRON HCL 4 MG/2ML IJ SOLN
4.0000 mg | Freq: Once | INTRAMUSCULAR | Status: AC
  Administered 2020-03-24: 4 mg via INTRAVENOUS
  Filled 2020-03-24: qty 2

## 2020-03-24 MED ORDER — IPRATROPIUM-ALBUTEROL 20-100 MCG/ACT IN AERS
4.0000 | INHALATION_SPRAY | Freq: Four times a day (QID) | RESPIRATORY_TRACT | Status: DC
  Administered 2020-03-24: 4 via RESPIRATORY_TRACT
  Filled 2020-03-24: qty 4

## 2020-03-24 MED ORDER — ONDANSETRON 4 MG PO TBDP: 4.0000 mg | ORAL_TABLET | Freq: Three times a day (TID) | ORAL | 0 refills | Status: AC | PRN

## 2020-03-24 MED ORDER — DICYCLOMINE HCL 10 MG PO CAPS: 10.0000 mg | ORAL_CAPSULE | Freq: Once | ORAL | Status: DC

## 2020-03-24 MED ORDER — LIDOCAINE VISCOUS HCL 2 % MT SOLN
15.0000 mL | Freq: Once | OROMUCOSAL | Status: AC
  Administered 2020-03-24: 15 mL via ORAL
  Filled 2020-03-24: qty 15

## 2020-03-24 MED ORDER — ALBUTEROL SULFATE HFA 108 (90 BASE) MCG/ACT IN AERS
4.0000 | INHALATION_SPRAY | Freq: Once | RESPIRATORY_TRACT | Status: AC
  Administered 2020-03-24: 4 via RESPIRATORY_TRACT
  Filled 2020-03-24: qty 6.7

## 2020-03-24 NOTE — ED Provider Notes (Addendum)
[facility] EMERGENCY DEPARTMENT Provider Note   CSN: [id number] Arrival date & time: 03/24/20  1024     History Chief Complaint  Patient presents with  . Emesis    Chase Hurley is a 50 y.o. male presenting for evaluation of n/v/d.   Pt states he has been having persistent nausea, vomiting, diarrhea for the past 4 days.  He had about 10 episodes of emesis and diarrhea last night, nonbloody nonbilious.  He has not been able to eat or drink anything since.  He denies associated fevers, chills, chest pain, urinary symptoms.  He does report mild abdominal pain, worse when vomiting.  It is diffuse.  He reports a chronic cough, unchanged from baseline, but states he is feeling slightly more short of breath.  He has been using an inhaler with improvement.  He denies sick contacts.  He is not vaccinated for COVID.  He reports no medical problems, states he takes no medications daily.  He reports 1 pack/day cigarette use, denies alcohol or drug use.  He has not taken anything for his symptoms.  Additional history obtained from chart review.  Per chart review, patient with a history of alcohol use and hep C.  HPI     Past Medical History:  Diagnosis Date  . Dyspnea    with exertion  . Hepatitis    Hepatitis C  . History of kidney stones     Patient Active Problem List   Diagnosis Date Noted  . Rhabdomyolysis 05/25/2017  . Traumatic rhabdomyolysis ([facility]) 05/25/2017  . Cellulitis and abscess of right leg 05/24/2017  . Alcohol abuse 05/24/2017  . Tobacco abuse 05/24/2017  . Hyponatremia 05/24/2017    Past Surgical History:  Procedure Laterality Date  . KNEE ARTHROSCOPY Right 07/03/2017   Procedure: Right knee arthroscopy with limited synovectomy and foreign body removal;  Surgeon: Yolonda Kida, MD;  Location: [facility] OR;  Service: Orthopedics;  Laterality: Right;  60 mins       History reviewed. No pertinent family history.  Social History   Tobacco Use  . Smoking status:  Current Every Day Smoker    Packs/day: 1.00    Years: 32.00    Pack years: 32.00    Types: Cigarettes  . Smokeless tobacco: Never Used  Vaping Use  . Vaping Use: Never used  Substance Use Topics  . Alcohol use: Not Currently    Comment: occ  . Drug use: Yes    Types: Marijuana    Comment: 07/02/17- 1 time a week    Home Medications Prior to Admission medications   Medication Sig Start Date End Date Taking? Authorizing Provider  alum & mag hydroxide-simeth (MAALOX ADVANCED MAX ST) 400-400-40 MG/5ML suspension Take 5 mLs by mouth every 6 (six) hours as needed for indigestion. 03/24/20  Yes Gavriella Hearst, PA-C  dicyclomine (BENTYL) 20 MG tablet Take 1 tablet (20 mg total) by mouth 2 (two) times daily as needed for spasms. 03/24/20  Yes Camri Molloy, PA-C  ondansetron (ZOFRAN ODT) 4 MG disintegrating tablet Take 1 tablet (4 mg total) by mouth every 8 (eight) hours as needed for nausea or vomiting. 03/24/20  Yes Cory Rama, PA-C  albuterol (PROVENTIL HFA;VENTOLIN HFA) 108 (90 Base) MCG/ACT inhaler Inhale 1-2 puffs into the lungs every 6 (six) hours as needed for wheezing or shortness of breath.    [provider]  cephALEXin (KEFLEX) 500 MG capsule Take 1 capsule (500 mg total) by mouth 4 (four) times daily. Patient not taking:  Reported on 06/19/2017 05/26/17   Catarina Hartshorn, MD  cephALEXin (KEFLEX) 500 MG capsule Take 1 capsule (500 mg total) by mouth 3 (three) times daily. Patient not taking: Reported on 06/19/2017 06/06/17   Vickki Hearing, MD  Multiple Vitamin (MULTIVITAMIN WITH MINERALS) TABS tablet Take 1 tablet by mouth daily.    [provider]  naproxen (NAPROSYN) 500 MG tablet Take 1 tablet (500 mg total) by mouth 2 (two) times daily with a meal. Patient taking differently: Take 500 mg by mouth daily as needed for moderate pain.  01/17/16   Darreld Mclean, MD    Allergies    Bee venom  Review of Systems   Review of Systems  Respiratory: Positive for  cough and shortness of breath.   Gastrointestinal: Positive for abdominal pain, diarrhea, nausea and vomiting.  Neurological: Positive for weakness.  All other systems reviewed and are negative.   Physical Exam Updated Vital Signs BP 127/75   Pulse 61   Temp 97.8 F (36.6 C) (Oral)   Resp 19   Ht 5\' 9"  (1.753 m)   Wt 95.3 kg   SpO2 95%   BMI 31.01 kg/m   Physical Exam Vitals and nursing note reviewed.  Constitutional:      General: He is not in acute distress.    Appearance: He is well-developed and well-nourished.     Comments: Appears nontoxic  HENT:     Head: Normocephalic and atraumatic.  Eyes:     Extraocular Movements: Extraocular movements intact and EOM normal.     Conjunctiva/sclera: Conjunctivae normal.     Pupils: Pupils are equal, round, and reactive to light.  Cardiovascular:     Rate and Rhythm: Normal rate and regular rhythm.     Pulses: Normal pulses and intact distal pulses.  Pulmonary:     Effort: Pulmonary effort is normal. No respiratory distress.     Breath sounds: Wheezing present.     Comments: Expiratory wheezing in all fields.  Speaking in full sentences.  Intermittent nonproductive cough noted on exam Abdominal:     General: There is no distension.     Palpations: Abdomen is soft. There is no mass.     Tenderness: There is no abdominal tenderness. There is no guarding or rebound.     Comments: No focal tenderness palpation of the abdomen.  No rigidity, guarding, distention.  Negative rebound.  No peritonitis  Musculoskeletal:        General: Normal range of motion.     Cervical back: Normal range of motion and neck supple.  Skin:    General: Skin is warm and dry.     Capillary Refill: Capillary refill takes less than 2 seconds.  Neurological:     Mental Status: He is alert and oriented to person, place, and time.  Psychiatric:        Mood and Affect: Mood and affect normal.     ED Results / Procedures / Treatments   Labs (all labs  ordered are listed, but only abnormal results are displayed) Labs Reviewed  CBC WITH DIFFERENTIAL/PLATELET - Abnormal; Notable for the following components:      Result Value   WBC 10.7 (*)    Hemoglobin 18.4 (*)    HCT 52.6 (*)    Neutro Abs 8.4 (*)    All other components within normal limits  COMPREHENSIVE METABOLIC PANEL - Abnormal; Notable for the following components:   Sodium 134 (*)    CO2 19 (*)  Glucose, Bld 120 (*)    Total Protein 8.4 (*)    All other components within normal limits  RESP PANEL BY RT-PCR (FLU A&B, COVID) ARPGX2  LIPASE, BLOOD  URINALYSIS, ROUTINE W REFLEX MICROSCOPIC    EKG EKG Interpretation  Date/Time:  Thursday March 24 2020 11:14:06 EST Ventricular Rate:  65 PR Interval:    QRS Duration: 89 QT Interval:  392 QTC Calculation: 408 R Axis:   75 Text Interpretation: Sinus rhythm Baseline wander in lead(s) II V5 No previous ECGs available Confirmed by Vanetta MuldersZackowski, Scott [phone]([id number]) on 03/24/2020 11:21:09 AM   Radiology No results found.  Procedures Procedures   Medications Ordered in ED Medications  Ipratropium-Albuterol (COMBIVENT) respimat 4 puff (4 puffs Inhalation Given 03/24/20 1350)  sodium chloride 0.9 % bolus 1,000 mL (0 mLs Intravenous Stopped 03/24/20 1333)  ondansetron (ZOFRAN) injection 4 mg (4 mg Intravenous Given 03/24/20 1133)  alum & mag hydroxide-simeth (MAALOX/MYLANTA) 200-200-20 MG/5ML suspension 30 mL (30 mLs Oral Given 03/24/20 1332)    And  lidocaine (XYLOCAINE) 2 % viscous mouth solution 15 mL (15 mLs Oral Given 03/24/20 1332)  albuterol (VENTOLIN HFA) 108 (90 Base) MCG/ACT inhaler 4 puff (4 puffs Inhalation Given 03/24/20 1332)    ED Course  I have reviewed the triage vital signs and the nursing notes.  Pertinent labs & imaging results that were available during my care of the patient were reviewed by me and considered in my medical decision making (see chart for details).    MDM Rules/Calculators/A&P                           Patient presented for evaluation nausea, vomiting, diarrhea, abdominal pain.  On exam, patient appears nontoxic.  Abdominal exam is overall reassuring.  He does have some associated respiratory symptoms including some increased shortness of breath, wheezing.  He has a baseline cough, unknown if it is changed.  Consider viral cause for symptoms, including Covid.  Less likely intra-abdominal infection as patient is afebrile and without focal abdominal pain.  Will obtain labs, chest x-ray, treat symptomatically, and reassess.  Labs interpreted by me, overall reassuring.  Nonspecific leukocytosis of 11, without fever or change in vital signs, doubt sepsis.  Doubt intra-abdominal infection without focal pain, I do not believe needs emergent CT scan.  Labs otherwise reassuring.  cxr read by radiologist, Dr. Tyron RussellBoles. No pna. Unfortunately due to a system issue, I was not able to see the image. On reassessment after symptomatic medications, patient reports symptoms are improved. Disucssed likely viral illness. Pt is tolerating p.o.  I discussed continued symptomatic management at home, follow-up with PCP as needed.  At this time, patient appears safe for discharge.  Return precautions given.  Patient states he understands and agrees to plan.     Radiology called and talked to attending, Dr. Deretha EmoryZackowski, stating that patient had abnormality on the initial x-ray that could be bone versus lung nodule.  Recommended two-view x-ray.  Prior to discharge, this will be ordered.  Repeat x-ray reviewed by radiology, no lung nodule identified.  Final Clinical Impression(s) / ED Diagnoses Final diagnoses:  Nausea vomiting and diarrhea  Viral illness  Wheezing    Rx / DC Orders ED Discharge Orders         Ordered    ondansetron (ZOFRAN ODT) 4 MG disintegrating tablet  Every 8 hours PRN        03/24/20 1424    alum & mag hydroxide-simeth (  MAALOX ADVANCED MAX ST) 400-400-40 MG/5ML suspension  Every 6 hours PRN         03/24/20 1424    dicyclomine (BENTYL) 20 MG tablet  2 times daily PRN        03/24/20 1424           Magalene Mclear, PA-C 03/24/20 1428    Freddy Spadafora, PA-C 03/24/20 1513    Vanetta Mulders, MD 04/06/20 1650

## 2020-03-24 NOTE — ED Triage Notes (Signed)
Pt c/o N/V/D x3 days. States unable to hold anything down

## 2020-03-24 NOTE — Discharge Instructions (Addendum)
Use Zofran as needed for nausea or vomiting. Use the maalox as needed for stomach pain.  Use bentyl as needed for abdominal cramping.  Use the albuterol every 4 hours while awake for the next 2 days.  After this, use as needed for shortness of breath, chest tightness, wheezing. Follow-up department care doctor as needed for recheck of her symptoms. Return to the emergency room if you develop high fevers, persistent vomiting, severe worsening pain, any new worsening, or concerning symptoms   Your repeat x-ray did not show any concerning lung nodule.

## 2020-04-20 ENCOUNTER — Emergency Department (HOSPITAL_COMMUNITY)
Admission: EM | Admit: 2020-04-20 | Discharge: 2020-04-20 | Disposition: A | Discharge status: Home / Self Care | Payer: Medicaid Other | Attending: Emergency Medicine | Admitting: Emergency Medicine

## 2020-04-20 ENCOUNTER — Encounter (HOSPITAL_COMMUNITY): Payer: Self-pay

## 2020-04-20 ENCOUNTER — Other Ambulatory Visit: Payer: Self-pay

## 2020-04-20 DIAGNOSIS — F17210 Nicotine dependence, cigarettes, uncomplicated: Secondary | ICD-10-CM | POA: Insufficient documentation

## 2020-04-20 DIAGNOSIS — K0889 Other specified disorders of teeth and supporting structures: Secondary | ICD-10-CM | POA: Diagnosis present

## 2020-04-20 MED ORDER — PENICILLIN V POTASSIUM 500 MG PO TABS: 500.0000 mg | ORAL_TABLET | Freq: Four times a day (QID) | ORAL | 0 refills | Status: DC

## 2020-04-20 MED ORDER — NAPROXEN 500 MG PO TABS: 500.0000 mg | ORAL_TABLET | Freq: Two times a day (BID) | ORAL | 0 refills | Status: DC

## 2020-04-20 MED ORDER — HYDROCODONE-ACETAMINOPHEN 5-325 MG PO TABS: 1.0000 | ORAL_TABLET | Freq: Four times a day (QID) | ORAL | 0 refills | Status: AC | PRN

## 2020-04-20 NOTE — Discharge Instructions (Addendum)
Take the antibiotic penicillin as directed for the next 7 days.  Take the Naprosyn which is an anti-inflammatory medicine which will help heal the tooth as directed for the next 7 days.  Take the hydrocodone prepack provided here as needed for more severe pain.  Make an appointment to follow-up with dentist.  Because tooth will be a problem again in the future.

## 2020-04-20 NOTE — ED Triage Notes (Signed)
Pt presents to ED with dental pain on bottom front tooth. Pt states pain x 2 days and swelling along jaw line.

## 2020-04-20 NOTE — ED Provider Notes (Signed)
[facility] EMERGENCY DEPARTMENT Provider Note   CSN: [id number] Arrival date & time: 04/20/20  0744     History Chief Complaint  Patient presents with  . Dental Pain    Chase Hurley is a 50 y.o. male.  Patient with complaint of tooth pain right side lower canine tooth for 2 days.  Associated with some swelling at the patient can feel along his jawline.  No swelling to the floor the mouth.  This tooth has been problematic in the past but normally settled down.  This time is more severe.        Past Medical History:  Diagnosis Date  . Dyspnea    with exertion  . Hepatitis    Hepatitis C  . History of kidney stones     Patient Active Problem List   Diagnosis Date Noted  . Rhabdomyolysis 05/25/2017  . Traumatic rhabdomyolysis (HCC) 05/25/2017  . Cellulitis and abscess of right leg 05/24/2017  . Alcohol abuse 05/24/2017  . Tobacco abuse 05/24/2017  . Hyponatremia 05/24/2017    Past Surgical History:  Procedure Laterality Date  . KNEE ARTHROSCOPY Right 07/03/2017   Procedure: Right knee arthroscopy with limited synovectomy and foreign body removal;  Surgeon: Yolonda Kida, MD;  Location: [facility] OR;  Service: Orthopedics;  Laterality: Right;  60 mins       No family history on file.  Social History   Tobacco Use  . Smoking status: Current Every Day Smoker    Packs/day: 1.00    Years: 32.00    Pack years: 32.00    Types: Cigarettes  . Smokeless tobacco: Never Used  Vaping Use  . Vaping Use: Never used  Substance Use Topics  . Alcohol use: Not Currently    Comment: occ  . Drug use: Yes    Types: Marijuana    Comment: 07/02/17- 1 time a week    Home Medications Prior to Admission medications   Medication Sig Start Date End Date Taking? Authorizing Provider  albuterol (PROVENTIL HFA;VENTOLIN HFA) 108 (90 Base) MCG/ACT inhaler Inhale 1-2 puffs into the lungs every 6 (six) hours as needed for wheezing or shortness of breath.   Yes [provider]  HYDROcodone-acetaminophen (NORCO/VICODIN) 5-325 MG tablet Take 1 tablet by mouth every 6 (six) hours as needed for moderate pain. 04/20/20  Yes Vanetta Mulders, MD  naproxen (NAPROSYN) 500 MG tablet Take 1 tablet (500 mg total) by mouth 2 (two) times daily. 04/20/20  Yes Vanetta Mulders, MD  penicillin v potassium (VEETID) 500 MG tablet Take 1 tablet (500 mg total) by mouth 4 (four) times daily for 7 days. 04/20/20 04/27/20 Yes Vanetta Mulders, MD  alum & mag hydroxide-simeth (MAALOX ADVANCED MAX ST) 400-400-40 MG/5ML suspension Take 5 mLs by mouth every 6 (six) hours as needed for indigestion. Patient not taking: Reported on 04/20/2020 03/24/20   Caccavale, Sophia, PA-C  cephALEXin (KEFLEX) 500 MG capsule Take 1 capsule (500 mg total) by mouth 4 (four) times daily. Patient not taking: No sig reported 05/26/17   Tat, Onalee Hua, MD  cephALEXin (KEFLEX) 500 MG capsule Take 1 capsule (500 mg total) by mouth 3 (three) times daily. Patient not taking: No sig reported 06/06/17   Vickki Hearing, MD  dicyclomine (BENTYL) 20 MG tablet Take 1 tablet (20 mg total) by mouth 2 (two) times daily as needed for spasms. Patient not taking: Reported on 04/20/2020 03/24/20   Caccavale, Sophia, PA-C  naproxen (NAPROSYN) 500 MG tablet Take 1 tablet (500 mg  total) by mouth 2 (two) times daily with a meal. Patient not taking: Reported on 04/20/2020 01/17/16   Darreld Mclean, MD  ondansetron (ZOFRAN ODT) 4 MG disintegrating tablet Take 1 tablet (4 mg total) by mouth every 8 (eight) hours as needed for nausea or vomiting. Patient not taking: Reported on 04/20/2020 03/24/20   Caccavale, Sophia, PA-C    Allergies    Bee venom  Review of Systems   Review of Systems  Constitutional: Negative for chills and fever.  HENT: Positive for dental problem. Negative for rhinorrhea and sore throat.   Eyes: Negative for visual disturbance.  Respiratory: Negative for cough and shortness of breath.   Cardiovascular: Negative for chest  pain and leg swelling.  Gastrointestinal: Negative for abdominal pain, diarrhea, nausea and vomiting.  Genitourinary: Negative for dysuria.  Musculoskeletal: Negative for back pain and neck pain.  Skin: Negative for rash.  Neurological: Negative for dizziness, light-headedness and headaches.  Hematological: Does not bruise/bleed easily.  Psychiatric/Behavioral: Negative for confusion.    Physical Exam Updated Vital Signs BP (!) 142/99 (BP Location: Right Arm)   Pulse 80   Temp 98.4 F (36.9 C) (Oral)   Resp 18   Ht 1.753 m (5\' 9" )   Wt 95.3 kg   SpO2 95%   BMI 31.01 kg/m   Physical Exam Vitals and nursing note reviewed.  Constitutional:      Appearance: Normal appearance. He is well-developed.  HENT:     Head: Normocephalic and atraumatic.     Mouth/Throat:     Mouth: Mucous membranes are moist.     Comments: Tenderness to palpation to right lower canine.  No swelling to the floor of the mouth.  No significant jawline swelling. Eyes:     Conjunctiva/sclera: Conjunctivae normal.     Pupils: Pupils are equal, round, and reactive to light.  Cardiovascular:     Rate and Rhythm: Normal rate and regular rhythm.     Heart sounds: No murmur heard.   Pulmonary:     Effort: Pulmonary effort is normal. No respiratory distress.     Breath sounds: Normal breath sounds.  Abdominal:     Palpations: Abdomen is soft.     Tenderness: There is no abdominal tenderness.  Musculoskeletal:     Cervical back: Normal range of motion and neck supple.  Skin:    General: Skin is warm and dry.     Capillary Refill: Capillary refill takes less than 2 seconds.  Neurological:     General: No focal deficit present.     Mental Status: He is alert and oriented to person, place, and time.     Cranial Nerves: No cranial nerve deficit.     Sensory: No sensory deficit.     Motor: No weakness.     ED Results / Procedures / Treatments   Labs (all labs ordered are listed, but only abnormal  results are displayed) Labs Reviewed - No data to display  EKG None  Radiology No results found.  Procedures Procedures   Medications Ordered in ED Medications - No data to display  ED Course  I have reviewed the triage vital signs and the nursing notes.  Pertinent labs & imaging results that were available during my care of the patient were reviewed by me and considered in my medical decision making (see chart for details).    MDM Rules/Calculators/A&P  Patient with tooth pain.  No significant mouth swelling.  Will treat with penicillin Naprosyn and a course of hydrocodone prepack since there is a shortage at most of the pharmacies   Final Clinical Impression(s) / ED Diagnoses Final diagnoses:  Pain, dental    Rx / DC Orders ED Discharge Orders         Ordered    naproxen (NAPROSYN) 500 MG tablet  2 times daily        04/20/20 0817    penicillin v potassium (VEETID) 500 MG tablet  4 times daily        04/20/20 0817    HYDROcodone-acetaminophen (NORCO/VICODIN) 5-325 MG tablet  Every 6 hours PRN        04/20/20 0817           Vanetta Mulders, MD 04/20/20 [phone]

## 2020-04-22 ENCOUNTER — Other Ambulatory Visit: Payer: Self-pay

## 2020-04-22 ENCOUNTER — Emergency Department (HOSPITAL_COMMUNITY)
Admission: EM | Admit: 2020-04-22 | Discharge: 2020-04-22 | Disposition: A | Discharge status: Home / Self Care | Payer: Medicaid Other | Attending: Emergency Medicine | Admitting: Emergency Medicine

## 2020-04-22 ENCOUNTER — Encounter (HOSPITAL_COMMUNITY): Payer: Self-pay | Admitting: *Deleted

## 2020-04-22 DIAGNOSIS — F17210 Nicotine dependence, cigarettes, uncomplicated: Secondary | ICD-10-CM | POA: Diagnosis not present

## 2020-04-22 DIAGNOSIS — K0889 Other specified disorders of teeth and supporting structures: Secondary | ICD-10-CM | POA: Insufficient documentation

## 2020-04-22 MED ORDER — CLINDAMYCIN HCL 150 MG PO CAPS: 450.0000 mg | ORAL_CAPSULE | Freq: Three times a day (TID) | ORAL | 0 refills | Status: AC

## 2020-04-22 MED ORDER — CLINDAMYCIN HCL 150 MG PO CAPS
450.0000 mg | ORAL_CAPSULE | Freq: Once | ORAL | Status: AC
  Administered 2020-04-22: 450 mg via ORAL
  Filled 2020-04-22: qty 3

## 2020-04-22 MED ORDER — KETOROLAC TROMETHAMINE 60 MG/2ML IM SOLN
60.0000 mg | Freq: Once | INTRAMUSCULAR | Status: AC
  Administered 2020-04-22: 60 mg via INTRAMUSCULAR
  Filled 2020-04-22: qty 2

## 2020-04-22 MED ORDER — HYDROCODONE-ACETAMINOPHEN 5-325 MG PO TABS
1.0000 | ORAL_TABLET | Freq: Once | ORAL | Status: AC
  Administered 2020-04-22: 1 via ORAL
  Filled 2020-04-22: qty 1

## 2020-04-22 NOTE — ED Provider Notes (Addendum)
[facility] EMERGENCY DEPARTMENT Provider Note   CSN: [id number] Arrival date & time: 04/22/20  1807     History Chief Complaint  Patient presents with  . Dental Problem    Chase Hurley is a 50 y.o. male with no relevant past medical history presents the ED with a 5-day history of dental pain.  I reviewed patient's medical record and he was evaluated on 04/20/2020 for same complaints and discharged home with penicillin antibiotics, naproxen, and a short course of Vicodin.  He was encouraged to follow-up with a dentist for definitive intervention.  On my examination, patient states that he has been taking the penicillin and naproxen, as directed.  He is also gone through his prescription of Vicodin.  He states that he is doing everything in his power and yet the pain is still intense and disturbing his day-to-day life.  He states that he can hardly take care of his 78-year-old son due to his excessive pain symptoms.  Patient reports that he called the dentist today, but they told him that he would have to call back next week in order to schedule an appointment.  He continues to smoke tobacco regularly.  He notes that he has continued to experience swelling involving the right side of his chin.  His eating has been limited due to his pain symptoms, but he can still swallow.    He denies any fevers, inability to swallow, shortness of breath, drooling, inability to open his mouth, or other symptoms.  HPI     Past Medical History:  Diagnosis Date  . Dyspnea    with exertion  . Hepatitis    Hepatitis C  . History of kidney stones     Patient Active Problem List   Diagnosis Date Noted  . Rhabdomyolysis 05/25/2017  . Traumatic rhabdomyolysis (HCC) 05/25/2017  . Cellulitis and abscess of right leg 05/24/2017  . Alcohol abuse 05/24/2017  . Tobacco abuse 05/24/2017  . Hyponatremia 05/24/2017    Past Surgical History:  Procedure Laterality Date  . KNEE ARTHROSCOPY Right 07/03/2017    Procedure: Right knee arthroscopy with limited synovectomy and foreign body removal;  Surgeon: Yolonda Kida, MD;  Location: [facility] OR;  Service: Orthopedics;  Laterality: Right;  60 mins       No family history on file.  Social History   Tobacco Use  . Smoking status: Current Every Day Smoker    Packs/day: 1.00    Years: 32.00    Pack years: 32.00    Types: Cigarettes  . Smokeless tobacco: Never Used  Vaping Use  . Vaping Use: Never used  Substance Use Topics  . Alcohol use: Not Currently    Comment: occ  . Drug use: Yes    Types: Marijuana    Comment: 07/02/17- 1 time a week    Home Medications Prior to Admission medications   Medication Sig Start Date End Date Taking? Authorizing Provider  clindamycin (CLEOCIN) 150 MG capsule Take 3 capsules (450 mg total) by mouth 3 (three) times daily for 7 days. 04/22/20 04/29/20 Yes Lorelee New, PA-C  albuterol (PROVENTIL HFA;VENTOLIN HFA) 108 (90 Base) MCG/ACT inhaler Inhale 1-2 puffs into the lungs every 6 (six) hours as needed for wheezing or shortness of breath.    [provider]  alum & mag hydroxide-simeth (MAALOX ADVANCED MAX ST) 400-400-40 MG/5ML suspension Take 5 mLs by mouth every 6 (six) hours as needed for indigestion. Patient not taking: Reported on 04/20/2020 03/24/20   Caccavale,  Sophia, PA-C  cephALEXin (KEFLEX) 500 MG capsule Take 1 capsule (500 mg total) by mouth 4 (four) times daily. Patient not taking: No sig reported 05/26/17   Tat, Onalee Hua, MD  cephALEXin (KEFLEX) 500 MG capsule Take 1 capsule (500 mg total) by mouth 3 (three) times daily. Patient not taking: No sig reported 06/06/17   Vickki Hearing, MD  dicyclomine (BENTYL) 20 MG tablet Take 1 tablet (20 mg total) by mouth 2 (two) times daily as needed for spasms. Patient not taking: Reported on 04/20/2020 03/24/20   Caccavale, Sophia, PA-C  HYDROcodone-acetaminophen (NORCO/VICODIN) 5-325 MG tablet Take 1 tablet by mouth every 6 (six) hours as needed for  moderate pain. 04/20/20   Vanetta Mulders, MD  naproxen (NAPROSYN) 500 MG tablet Take 1 tablet (500 mg total) by mouth 2 (two) times daily with a meal. Patient not taking: Reported on 04/20/2020 01/17/16   Darreld Mclean, MD  naproxen (NAPROSYN) 500 MG tablet Take 1 tablet (500 mg total) by mouth 2 (two) times daily. 04/20/20   Vanetta Mulders, MD  ondansetron (ZOFRAN ODT) 4 MG disintegrating tablet Take 1 tablet (4 mg total) by mouth every 8 (eight) hours as needed for nausea or vomiting. Patient not taking: Reported on 04/20/2020 03/24/20   Caccavale, Sophia, PA-C    Allergies    Bee venom  Review of Systems   Review of Systems  Constitutional: Negative for fever.  HENT: Positive for dental problem and facial swelling. Negative for drooling, sore throat, trouble swallowing and voice change.   Respiratory: Negative for shortness of breath, wheezing and stridor.     Physical Exam Updated Vital Signs BP 139/78   Pulse 70   Temp 98.1 F (36.7 C) (Oral)   Resp (!) 22   Ht 5\' 9"  (1.753 m)   Wt 95.3 kg   SpO2 93%   BMI 31.01 kg/m   Physical Exam Vitals and nursing note reviewed. Exam conducted with a chaperone present.  Constitutional:      Appearance: Normal appearance.  HENT:     Head: Normocephalic and atraumatic.     Mouth/Throat:     Comments: Patent oropharynx.  No tonsillar hypertrophy or exudates noted.  No floor of mouth swelling.  No trismus.  Tolerating secretions well. Generally poor dentition throughout.  Swelling and erythema noted at base of #26 tooth.  Mild associated mandibular/swelling involving chin.  No significant submandibular swelling.  No overlying skin changes. Eyes:     General: No scleral icterus.    Conjunctiva/sclera: Conjunctivae normal.  Neck:     Comments: No meningismus.  No asymmetries. Cardiovascular:     Rate and Rhythm: Normal rate.     Pulses: Normal pulses.  Pulmonary:     Effort: Pulmonary effort is normal. No respiratory distress.      Breath sounds: Normal breath sounds. No wheezing or rales.  Musculoskeletal:     Cervical back: Normal range of motion and neck supple. No rigidity or tenderness.  Skin:    General: Skin is dry.  Neurological:     Mental Status: He is alert and oriented to person, place, and time.     GCS: GCS eye subscore is 4. GCS verbal subscore is 5. GCS motor subscore is 6.  Psychiatric:        Mood and Affect: Mood normal.        Behavior: Behavior normal.        Thought Content: Thought content normal.     ED Results / Procedures /  Treatments   Labs (all labs ordered are listed, but only abnormal results are displayed) Labs Reviewed - No data to display  EKG None  Radiology No results found.  Procedures Procedures   Medications Ordered in ED Medications  clindamycin (CLEOCIN) capsule 450 mg (has no administration in time range)  HYDROcodone-acetaminophen (NORCO/VICODIN) 5-325 MG per tablet 1 tablet (has no administration in time range)  ketorolac (TORADOL) injection 60 mg (has no administration in time range)    ED Course  I have reviewed the triage vital signs and the nursing notes.  Pertinent labs & imaging results that were available during my care of the patient were reviewed by me and considered in my medical decision making (see chart for details).    MDM Rules/Calculators/A&P                          Patient presents to the ED with dental pain, but without any abscess amenable to drainage.  He does have mild right-sided mandibular swelling, but no warmth, redness, or other overlying skin changes.  I have low suspicion for a large mandibular or other deep tissue abscess at this time.  Do not feel as though CT imaging warranted at this time.  He denies any fevers or chills, difficulty swallowing or breathing, or other symptoms.  Patient able to speak in complete sentences without difficulty swallowing or trouble handling secretions.  No stridor, wheezing, tripoding, grey  pseudomembrane, trismus, uvular deviation, sublingual/submandibular/submental swelling or induration, nuchal rigidity, or neck pain.  Low suspicion for deep tissue infection such as mandibular or maxillary abscess, PTA or RPA, epiglottitis, or other emergent pathology.   We will provide patient with first dose of clindamycin here in the ED as well as a shot of Toradol.  Will also give patient a second pain medication given his discomfort, but will not provide him with further narcotics at home.  Will discharge him home with continued clindamycin antibiotics and provide patient with dental resources. Encouraged close outpatient follow-up.  ED return precautions discussed.  Patient voices understanding and is agreeable to the plan.  The patient was counseled on the dangers of tobacco use, and was advised to quit.  Reviewed strategies to maximize success, including removing cigarettes and smoking materials from environment, stress management, substitution of other forms of reinforcement, support of family/friends and written materials. Total time was 5 min CPT code 96045.    Final Clinical Impression(s) / ED Diagnoses Final diagnoses:  Pain, dental    Rx / DC Orders ED Discharge Orders         Ordered    clindamycin (CLEOCIN) 150 MG capsule  3 times daily        04/22/20 1903           Lorelee New, PA-C 04/22/20 1909    Lorelee New, PA-C 04/22/20 1948    Eber Hong, MD 04/23/20 1459

## 2020-04-22 NOTE — Discharge Instructions (Signed)
You appear to have a dental infection that has not improved despite 2 full days of penicillin.  It is reasonable to switch you to clindamycin antibiotics, which I believe may have better efficacy.  Please discontinue the penicillin and take the clindamycin, as directed.  Continue take the naproxen, as directed.  Do not combine with other NSAIDs.  Take Tylenol as needed for breakthrough pain.  You may also go to the pharmacy and pick up Orajel and other topical agents over-the-counter.  Narcotics are not going to be the answer.  You will need definitive intervention with a dentist instead.  It is vitally important that you schedule an appointment as soon as possible.  Follow-up with your primary care provider regarding your multiple ER encounters.  Return to the ED or seek immediate medical attention should you develop any new or worsening symptoms.

## 2020-04-22 NOTE — ED Triage Notes (Signed)
Dental pain for 5 days, seen here 2 days ago and prescribed antibiotics, denies any relief

## 2022-03-21 IMAGING — CR DG CHEST 2V
2 series · 2 of 2 positions shown · non-contrast
Comparison: Earlier portable exam of 03/24/2020

CLINICAL DATA: Question LEFT first costochondral junction
prominence versus LEFT upper lobe nodule on portable chest
radiograph

EXAM:
CHEST - 2 VIEW

[w pa chest]
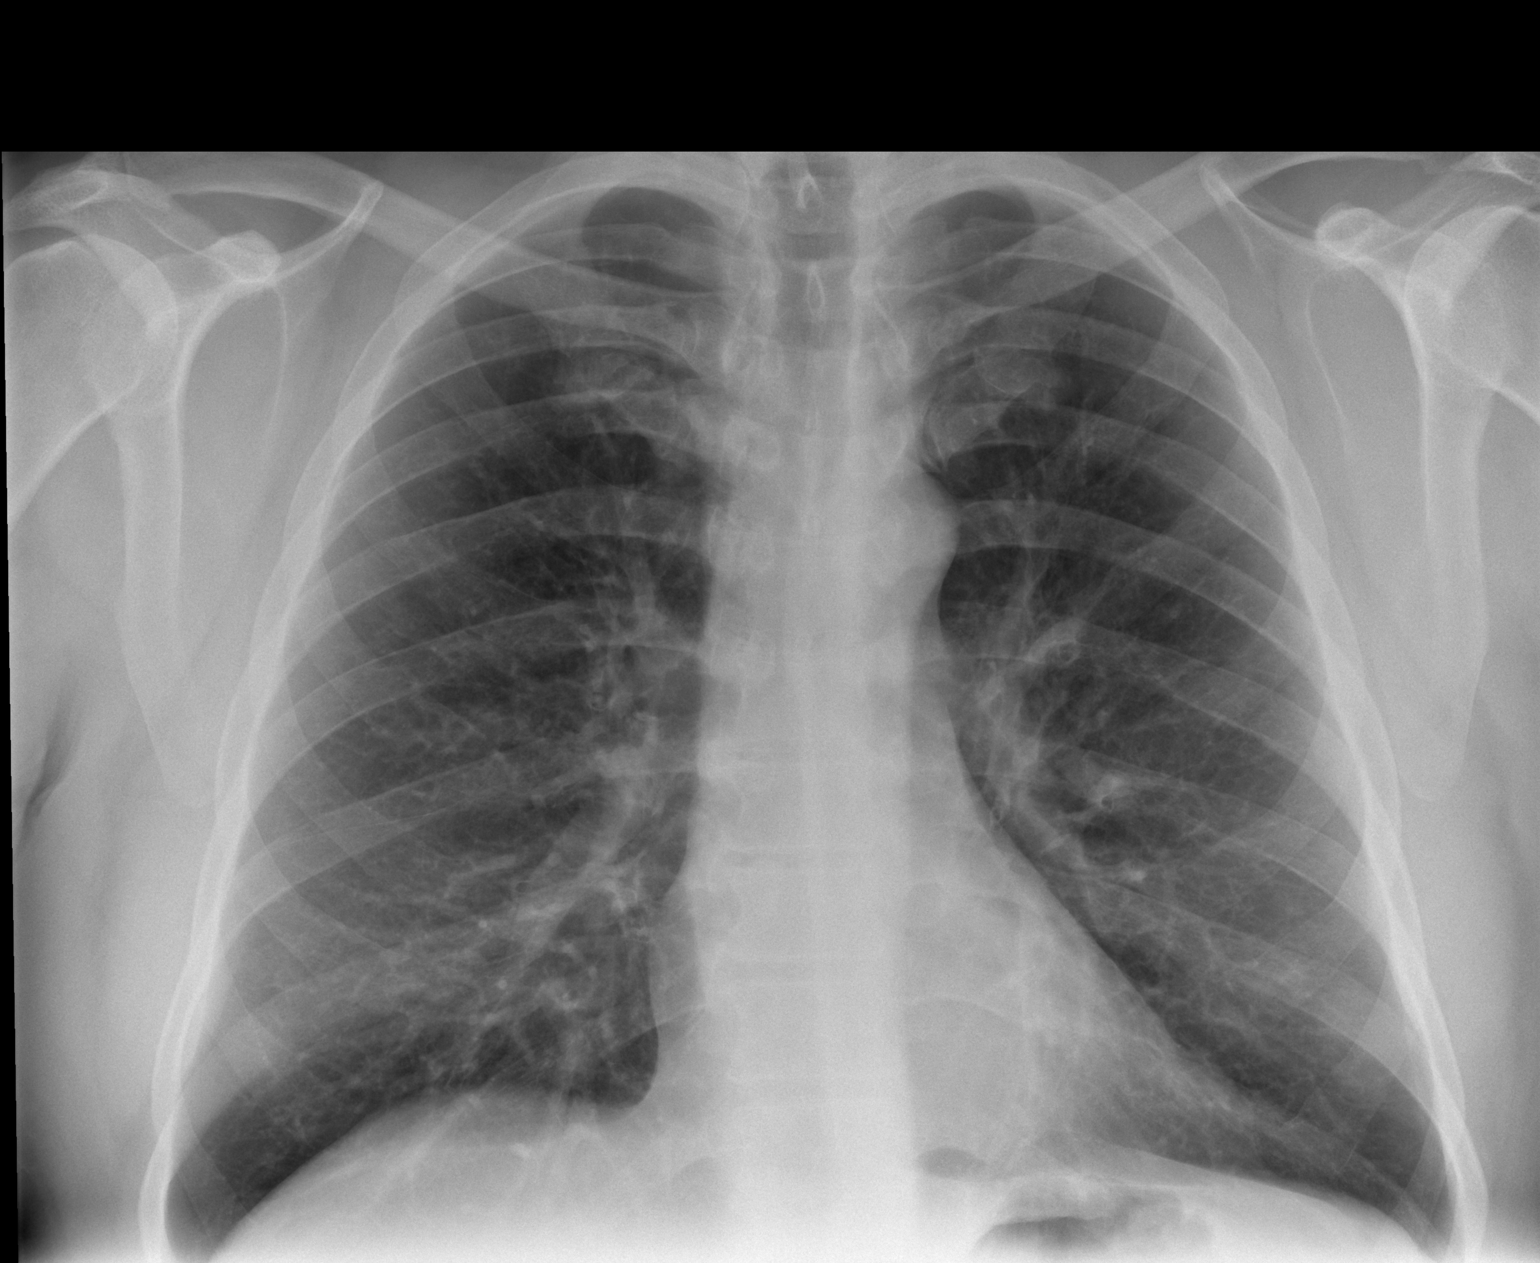

[w chest lat]
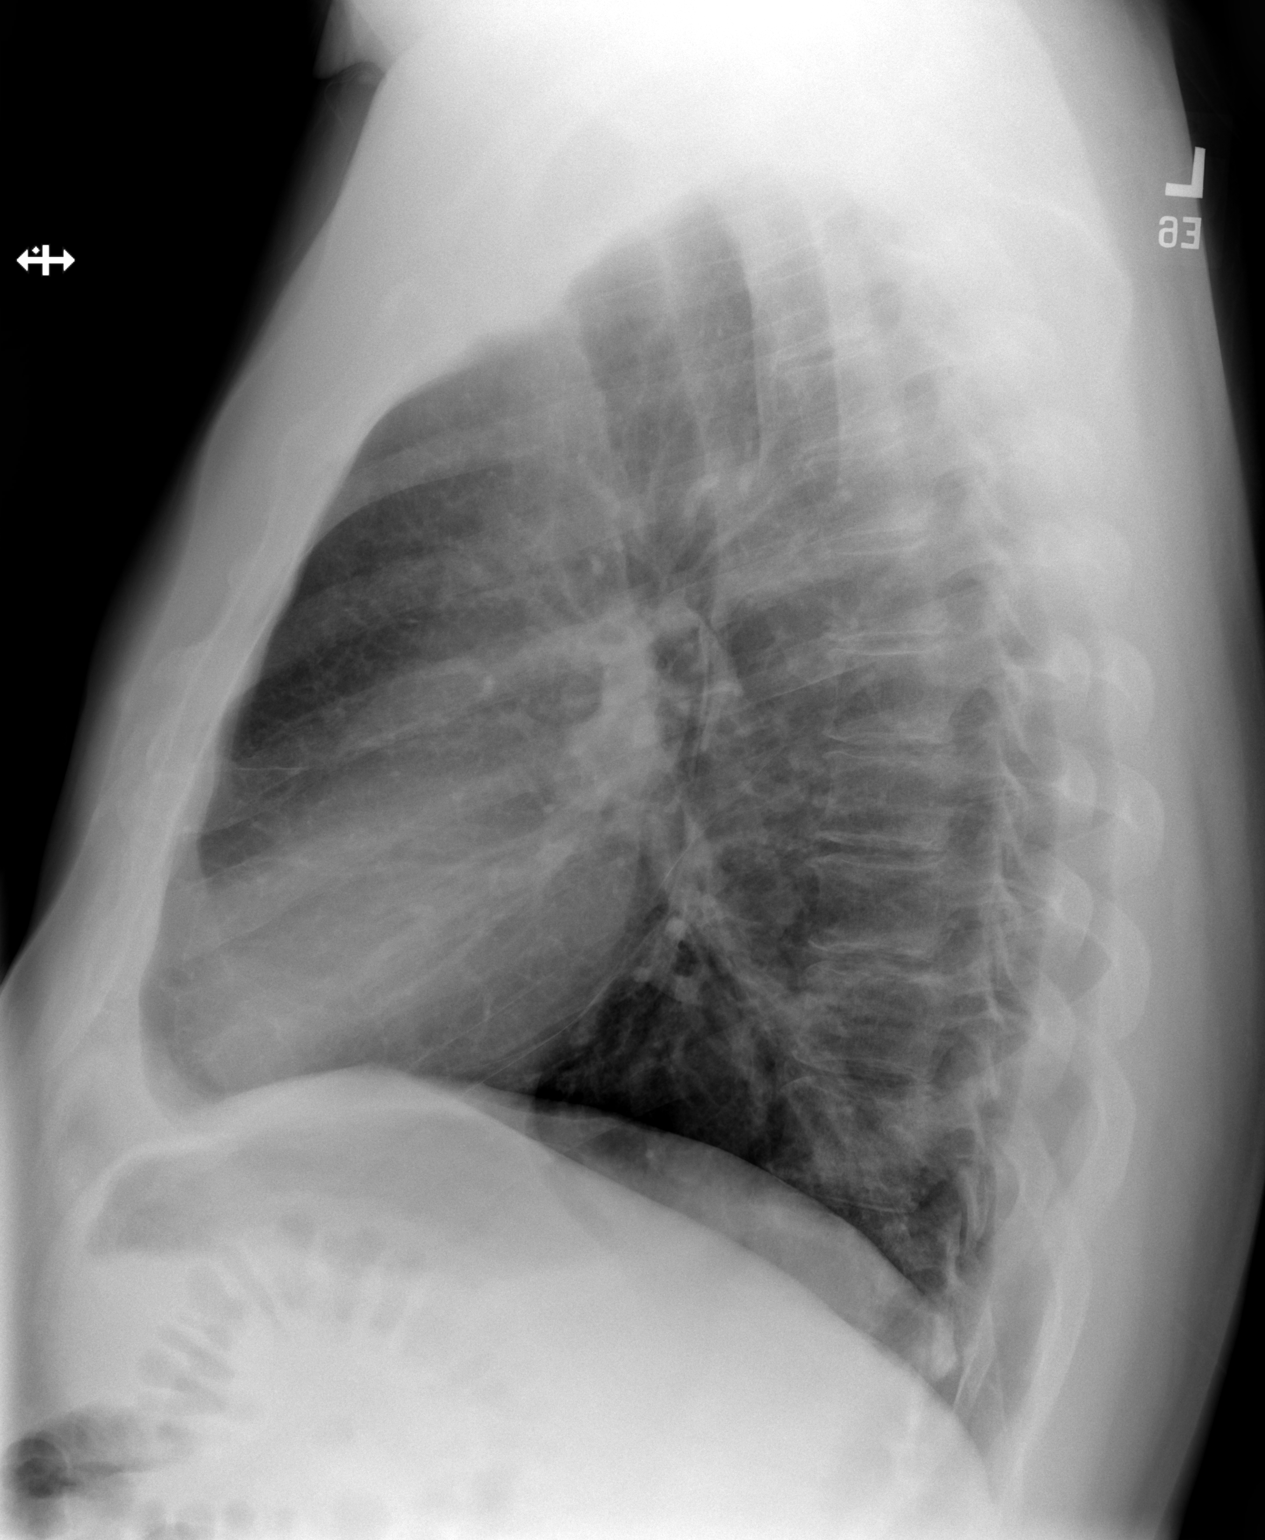

[2 of 2 positions shown; findings below may reference images not displayed]

FINDINGS: Normal heart size, mediastinal contours, and pulmonary vascularity.

First costochondral junctions now appears similar in size and
similar to prior exams.

No pulmonary nodule identified.

Mild central peribronchial thickening, chronic.

No acute osseous findings.
IMPRESSION: No evidence of pulmonary nodule.

Mild chronic bronchitic changes.

## 2022-03-21 IMAGING — DX DG CHEST 1V
1 series · 2 of 2 positions shown · non-contrast
Comparison: Portable exam 9994 hours compared to 11/03/2011

CLINICAL DATA: Shortness of breath

EXAM:
CHEST  1 VIEW

[Series 1: chest ap · 0.14mm/px · 2 of 2 slices shown]
[im 1/2]
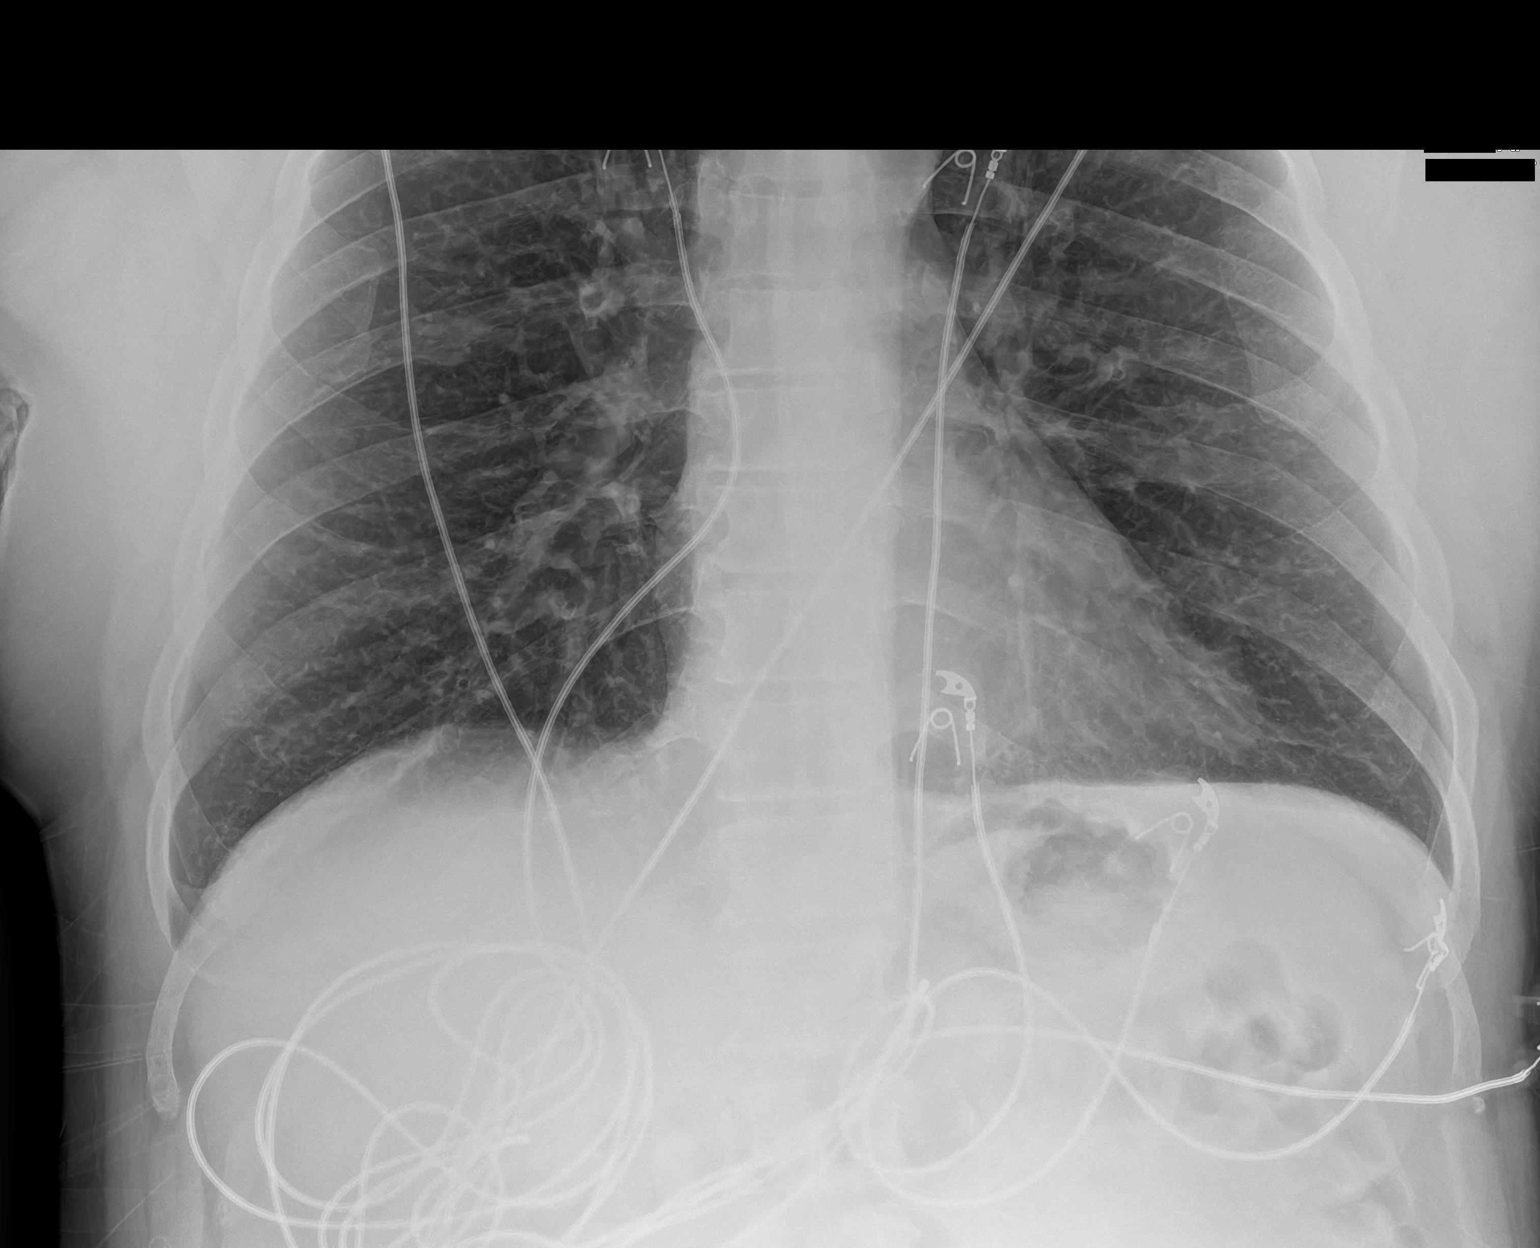
[im 2/2]
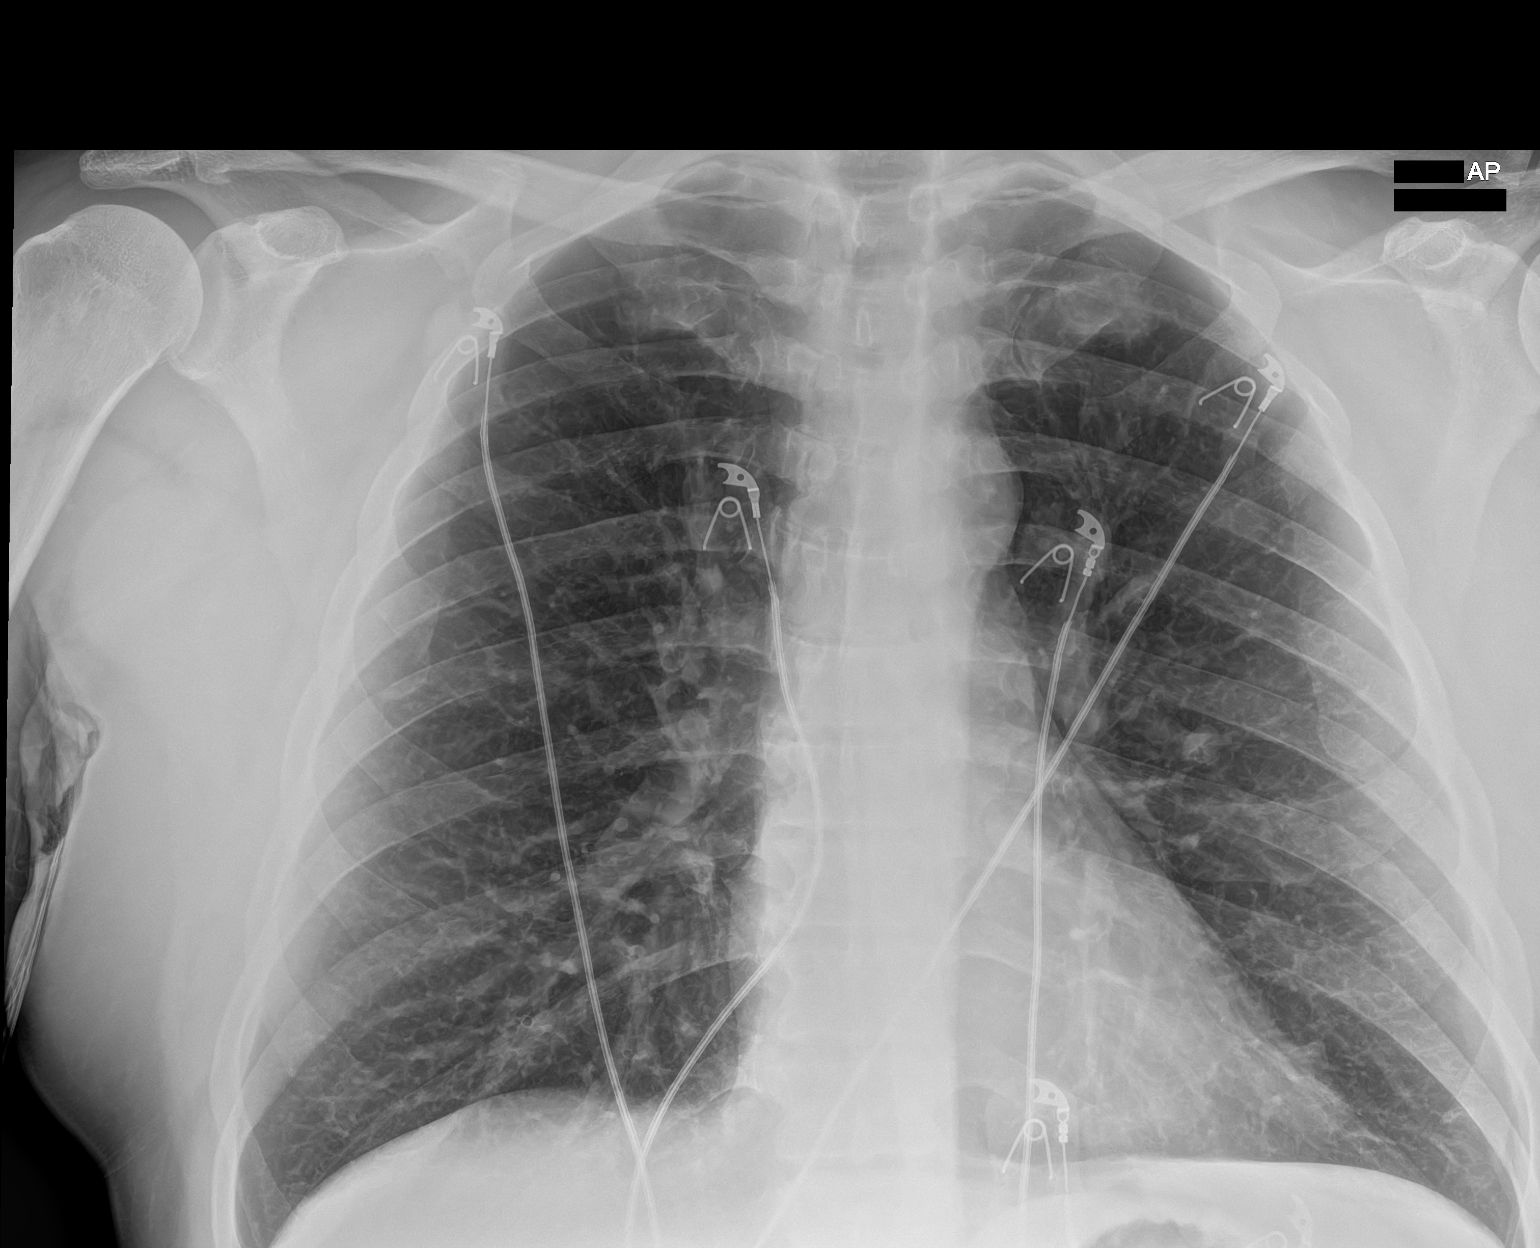

[2 of 2 positions shown; findings below may reference images not displayed]

FINDINGS: Normal heart size, mediastinal contours, and pulmonary vascularity.

Prominent first costochondral junctions base asymmetrically greater
on LEFT.

This is more prominent than on previous exams, suspect related to
asymmetric first costochondral junctions but LEFT upper lobe
pulmonary nodule not entirely excluded and follow-up evaluation by
upright two view chest radiograph is recommended.

Lungs otherwise clear.

No acute infiltrate, pleural effusion or pneumothorax.

Osseous structures otherwise unremarkable.
IMPRESSION: No acute abnormalities.

LEFT upper lobe density likely related to asymmetric first
costochondral junctions but more prominent than on previous exam;
follow-up upright PA and lateral chest radiographs recommended for
further assessment.

Findings called to Dr.Giorgi on 03/24/2020 at 1577 hours.

## 2024-01-01 ENCOUNTER — Emergency Department (HOSPITAL_COMMUNITY)
Admission: EM | Admit: 2024-01-01 | Discharge: 2024-01-01 | Disposition: A | Discharge status: Home / Self Care | Attending: Emergency Medicine | Admitting: Emergency Medicine

## 2024-01-01 ENCOUNTER — Encounter (HOSPITAL_COMMUNITY): Payer: Self-pay

## 2024-01-01 ENCOUNTER — Other Ambulatory Visit: Payer: Self-pay

## 2024-01-01 DIAGNOSIS — F17210 Nicotine dependence, cigarettes, uncomplicated: Secondary | ICD-10-CM | POA: Diagnosis not present

## 2024-01-01 DIAGNOSIS — K0889 Other specified disorders of teeth and supporting structures: Secondary | ICD-10-CM | POA: Insufficient documentation

## 2024-01-01 MED ORDER — KETOROLAC TROMETHAMINE 15 MG/ML IJ SOLN
30.0000 mg | Freq: Once | INTRAMUSCULAR | Status: AC
  Administered 2024-01-01: 30 mg via INTRAMUSCULAR
  Filled 2024-01-01: qty 2

## 2024-01-01 MED ORDER — NAPROXEN 500 MG PO TABS: 500.0000 mg | ORAL_TABLET | Freq: Two times a day (BID) | ORAL | 0 refills | Status: AC

## 2024-01-01 MED ORDER — OXYCODONE HCL 5 MG PO TABS
5.0000 mg | ORAL_TABLET | Freq: Once | ORAL | Status: AC
  Administered 2024-01-01: 5 mg via ORAL
  Filled 2024-01-01: qty 1

## 2024-01-01 MED ORDER — OXYCODONE HCL 5 MG PO TABS: 5.0000 mg | ORAL_TABLET | ORAL | 0 refills | Status: AC | PRN

## 2024-01-01 MED ORDER — AMOXICILLIN 250 MG PO CAPS
500.0000 mg | ORAL_CAPSULE | Freq: Once | ORAL | Status: AC
  Administered 2024-01-01: 500 mg via ORAL
  Filled 2024-01-01: qty 2

## 2024-01-01 MED ORDER — AMOXICILLIN 500 MG PO CAPS: 500.0000 mg | ORAL_CAPSULE | Freq: Three times a day (TID) | ORAL | 0 refills | Status: AC

## 2024-01-01 NOTE — ED Triage Notes (Signed)
 Pt states he has broken tooth in the top front of his mouth & an abscess on the roof of his mouth at that tooth causing his whole head to hurt. States OTC medications at home is not providing any relief.

## 2024-01-01 NOTE — Discharge Instructions (Signed)
 You were evaluated in the Emergency Department and after careful evaluation, we did not find any emergent condition requiring admission or further testing in the hospital.  Your exam/testing today is overall reassuring.  Symptoms likely due to a dental infection.  Very important that you follow-up with a dentist.  Take the amoxicillin antibiotic as directed to clear the infection.  Use the Naprosyn  anti-inflammatory twice daily for pain.  Can use the oxycodone  as needed for more significant pain.  Please return to the Emergency Department if you experience any worsening of your condition.   Thank you for allowing us  to be a part of your care.

## 2024-01-01 NOTE — ED Provider Notes (Signed)
 [facility]-EMERGENCY DEPT [facility] Emergency Department Provider Note MRN:  [id number]  Arrival date & time: 01/01/24     Chief Complaint   Dental Pain   History of Present Illness   Chase Hurley is a 53 y.o. year-old male with no pertinent past medical history presenting to the ED with chief complaint of dental pain.  Painful teeth for the past 2 days, trouble sleeping.  Pain at tooth #8 and tooth #25.  No fever, no other complaints.  Has a blister next to his tooth.  Review of Systems  A thorough review of systems was obtained and all systems are negative except as noted in the HPI and PMH.   Patient's Health History    Past Medical History:  Diagnosis Date   Dyspnea    with exertion   Hepatitis    Hepatitis C   History of kidney stones     Past Surgical History:  Procedure Laterality Date   KNEE ARTHROSCOPY Right 07/03/2017   Procedure: Right knee arthroscopy with limited synovectomy and foreign body removal;  Surgeon: Sharl Selinda Dover, MD;  Location: [facility] OR;  Service: Orthopedics;  Laterality: Right;  60 mins    History reviewed. No pertinent family history.  Social History   Socioeconomic History   Marital status: Single    Spouse name: Not on file   Number of children: Not on file   Years of education: Not on file   Highest education level: Not on file  Occupational History   Not on file  Tobacco Use   Smoking status: Every Day    Current packs/day: 1.00    Average packs/day: 1 pack/day for 32.0 years (32.0 ttl pk-yrs)    Types: Cigarettes   Smokeless tobacco: Never  Vaping Use   Vaping status: Never Used  Substance and Sexual Activity   Alcohol use: Not Currently    Comment: occ   Drug use: Yes    Types: Marijuana    Comment: 07/02/17- 1 time a week   Sexual activity: Yes    Birth control/protection: None  Other Topics Concern   Not on file  Social History Narrative   ** Merged History Encounter **       Social Drivers of Manufacturing Engineer Strain: Not on file  Food Insecurity: Not on file  Transportation Needs: Not on file  Physical Activity: Not on file  Stress: Not on file  Social Connections: Not on file  Intimate Partner Violence: Not on file     Physical Exam   Vitals:   01/01/24 0555  BP: (!) 155/98  Pulse: 76  Resp: 17  Temp: 98.4 F (36.9 C)  SpO2: 93%    CONSTITUTIONAL: Well-appearing, appears mildly uncomfortable NEURO/PSYCH:  Alert and oriented x 3, no focal deficits EYES:  eyes equal and reactive ENT/NECK:  no LAD, no JVD CARDIO: Regular rate, well-perfused, normal S1 and S2 PULM:  CTAB no wheezing or rhonchi GI/GU:  non-distended, non-tender MSK/SPINE:  No gross deformities, no edema SKIN:  no rash, atraumatic   *Additional and/or pertinent findings included in MDM below  Diagnostic and Interventional Summary    EKG Interpretation Date/Time:    Ventricular Rate:    PR Interval:    QRS Duration:    QT Interval:    QTC Calculation:   R Axis:      Text Interpretation:         Labs Reviewed - No data to display  No orders to  display    Medications  ketorolac  (TORADOL ) 15 MG/ML injection 30 mg (30 mg Intramuscular Given 01/01/24 0637)  oxyCODONE  (Oxy IR/ROXICODONE ) immediate release tablet 5 mg (5 mg Oral Given 01/01/24 0636)  amoxicillin (AMOXIL) capsule 500 mg (500 mg Oral Given 01/01/24 0636)     Procedures  /  Critical Care Procedures  ED Course and Medical Decision Making  Initial Impression and Ddx Very poor dentition with advanced dental decay.  The teeth are tender.  He may be developing a small adjacent gingival abscess but no signs of more significant contiguous infection, no systemic symptoms.  Suspect he will respond well to the antibiotics, will need dental follow-up.  Past medical/surgical history that increases complexity of ED encounter: None  Interpretation of Diagnostics Laboratory and/or imaging options to aid in the diagnosis/care of the  patient were considered.  After careful history and physical examination, it was determined that there was no indication for diagnostics at this time.  Patient Reassessment and Ultimate Disposition/Management     Discharged with return precautions.  Patient management required discussion with the following services or consulting groups:  None  Complexity of Problems Addressed Acute complicated illness or Injury  Additional Data Reviewed and Analyzed Further history obtained from: None  Additional Factors Impacting ED Encounter Risk Prescriptions  Ozell HERO. Theadore, MD [facility] Emergency Medicine [facility] [email or web address]  Final Clinical Impressions(s) / ED Diagnoses     ICD-10-CM   1. Pain, dental  K08.89       ED Discharge Orders          Ordered    amoxicillin (AMOXIL) 500 MG capsule  3 times daily        01/01/24 0647    naproxen  (NAPROSYN ) 500 MG tablet  2 times daily        01/01/24 0647    oxyCODONE  (ROXICODONE ) 5 MG immediate release tablet  Every 4 hours PRN        01/01/24 9352             Discharge Instructions Discussed with and Provided to Patient:    Discharge Instructions      You were evaluated in the Emergency Department and after careful evaluation, we did not find any emergent condition requiring admission or further testing in the hospital.  Your exam/testing today is overall reassuring.  Symptoms likely due to a dental infection.  Very important that you follow-up with a dentist.  Take the amoxicillin antibiotic as directed to clear the infection.  Use the Naprosyn  anti-inflammatory twice daily for pain.  Can use the oxycodone  as needed for more significant pain.  Please return to the Emergency Department if you experience any worsening of your condition.   Thank you for allowing us  to be a part of your care.      Theadore Ozell HERO, MD 01/01/24 [phone]
